# Patient Record
Sex: Female | Born: 1995 | Race: White | Hispanic: Yes | Smoking: Never smoker
Health system: Southern US, Community
[De-identification: ages and names within clinical notes are randomized; demographics above are authoritative.]

## PROBLEM LIST (undated history)

## (undated) DIAGNOSIS — O1403 Mild to moderate pre-eclampsia, third trimester: Secondary | ICD-10-CM

## (undated) DIAGNOSIS — Z789 Other specified health status: Secondary | ICD-10-CM

## (undated) HISTORY — PX: OTHER SURGICAL HISTORY: SHX169

## (undated) HISTORY — DX: Other specified health status: Z78.9

---

## 2016-10-16 DIAGNOSIS — E663 Overweight: Secondary | ICD-10-CM | POA: Insufficient documentation

## 2016-10-16 LAB — HM HIV SCREENING LAB: HM HIV Screening: NEGATIVE

## 2017-11-06 LAB — HM PAP SMEAR: HM Pap smear: NEGATIVE

## 2019-07-30 DIAGNOSIS — E663 Overweight: Secondary | ICD-10-CM

## 2019-07-31 ENCOUNTER — Other Ambulatory Visit: Payer: Self-pay

## 2019-07-31 ENCOUNTER — Ambulatory Visit (LOCAL_COMMUNITY_HEALTH_CENTER): Payer: Self-pay

## 2019-07-31 VITALS — BP 128/77 | Ht 66.0 in | Wt 177.8 lb

## 2019-07-31 DIAGNOSIS — Z30013 Encounter for initial prescription of injectable contraceptive: Secondary | ICD-10-CM

## 2019-07-31 DIAGNOSIS — Z3009 Encounter for other general counseling and advice on contraception: Secondary | ICD-10-CM

## 2019-07-31 DIAGNOSIS — Z3042 Encounter for surveillance of injectable contraceptive: Secondary | ICD-10-CM

## 2019-07-31 MED ORDER — MEDROXYPROGESTERONE ACETATE 150 MG/ML IM SUSP
150.0000 mg | Freq: Once | INTRAMUSCULAR | Status: AC
  Administered 2019-07-31: 17:00:00 150 mg via INTRAMUSCULAR

## 2019-07-31 NOTE — Progress Notes (Signed)
Patient in for Depo at 11 3/7. Depo given today, right deltoid, per 12/04/2018 order Wilburn Mylar, FNP) for DMPA 150mg , q 11-13 wks, for 1 year. Patient tolerated Depo well, and given next Depo due card.Jenetta Downer, RN

## 2019-10-20 ENCOUNTER — Other Ambulatory Visit: Payer: Self-pay

## 2019-10-20 ENCOUNTER — Ambulatory Visit (LOCAL_COMMUNITY_HEALTH_CENTER): Payer: Self-pay

## 2019-10-20 VITALS — BP 125/66 | Ht 66.0 in | Wt 176.5 lb

## 2019-10-20 DIAGNOSIS — Z3009 Encounter for other general counseling and advice on contraception: Secondary | ICD-10-CM

## 2019-10-20 DIAGNOSIS — Z30013 Encounter for initial prescription of injectable contraceptive: Secondary | ICD-10-CM

## 2019-10-20 MED ORDER — MEDROXYPROGESTERONE ACETATE 150 MG/ML IM SUSP
150.0000 mg | Freq: Once | INTRAMUSCULAR | Status: AC
  Administered 2019-10-20: 16:00:00 150 mg via INTRAMUSCULAR

## 2019-10-20 NOTE — Progress Notes (Signed)
Last physical at [facility] was 12/04/2018. Last depo at [facility] 07/31/2019; 11.4 weeks post depo.  DMPA 150 mg IM administered per Jerline Pain, FNP order dated 12/04/2018. Language Line used.

## 2020-01-07 ENCOUNTER — Encounter: Payer: Self-pay | Admitting: Family Medicine

## 2020-01-07 ENCOUNTER — Other Ambulatory Visit: Payer: Self-pay

## 2020-01-07 ENCOUNTER — Ambulatory Visit (LOCAL_COMMUNITY_HEALTH_CENTER): Payer: Self-pay | Admitting: Family Medicine

## 2020-01-07 VITALS — BP 108/75 | Ht 65.0 in | Wt 182.4 lb

## 2020-01-07 DIAGNOSIS — Z3042 Encounter for surveillance of injectable contraceptive: Secondary | ICD-10-CM

## 2020-01-07 DIAGNOSIS — Z30013 Encounter for initial prescription of injectable contraceptive: Secondary | ICD-10-CM

## 2020-01-07 DIAGNOSIS — Z01419 Encounter for gynecological examination (general) (routine) without abnormal findings: Secondary | ICD-10-CM

## 2020-01-07 MED ORDER — THERA VITAL M PO TABS: 1.0000 | ORAL_TABLET | Freq: Every day | ORAL | 3 refills | Status: AC

## 2020-01-07 MED ORDER — MEDROXYPROGESTERONE ACETATE 150 MG/ML IM SUSP
150.0000 mg | INTRAMUSCULAR | Status: AC
  Administered 2020-01-07 – 2020-09-07 (×3): 150 mg via INTRAMUSCULAR

## 2020-01-07 NOTE — Progress Notes (Signed)
Family Planning Visit- Initial Visit  Subjective:  Hannah Savage is a 24 y.o. being seen today for an initial well woman visit and to discuss family planning options.  She is currently using Depo-Provera injections for pregnancy prevention. Patient reports she does not want a pregnancy in the next year.  Patient has the following medical conditions has Overweight on their problem list.  Chief Complaint  Patient presents with  . Contraception    Patient reports she is here for physical and depo. Has been using Depo x3 yrs, does not get periods.   Pt denies all of the following, which are contraindications to Depo use: Known breast cancer Pregnancy Also denies: Hypertension (CDC cat 2 if mild, cat 3 if severe) Severe cirrhosis, hepatocellular adenoma Diabetes with nephrosis or vascular complications Ischemic heart disease or multiple risk factors for atherosclerotic disease, and some forms of lupus Unexplained vaginal bleeding Pregnancy planned within the next year Long-term use of corticosteroid therapy in women with a history of, or risk factors for, nontraumatic (frailty) fractures.  Current use of aminoglutethimide (usually for the treatment of Cushing's syndrome) because aminoglutethimide may increase metabolism of progestins   Body mass index is 30.35 kg/m. - Patient is eligible for diabetes screening based on BMI and age >69?  not applicable HA1C ordered? not applicable  Patient reports 1 of partners in last year. Desires STI screening?  No - decliens all STI testing today  Does the patient have a current or past history of drug use? No   No components found for: HCV]   Health Maintenance Due  Topic Date Due  . TETANUS/TDAP  07/08/2015  . INFLUENZA VACCINE  07/25/2019    ROS  The following portions of the patient's history were reviewed and updated as appropriate: allergies, current medications, past family history, past medical history, past social  history, past surgical history and problem list. Problem list updated.   See flowsheet for other program required questions.  Objective:   Vitals:   01/07/20 1553  BP: 108/75  Weight: 182 lb 6.4 oz (82.7 kg)  Height: 5\' 5"  (1.651 m)    Physical Exam  Vitals and nursing note reviewed.  Constitutional:      Appearance: Normal appearance.  HENT:     Head: Normocephalic and atraumatic.     Mouth/Throat:     Mouth: Mucous membranes are moist.     Pharynx: Oropharynx is clear. No oropharyngeal exudate or posterior oropharyngeal erythema.  Eyes:     Conjunctiva/sclera: Conjunctivae normal.  Neck:     Thyroid: No thyroid mass, thyromegaly or thyroid tenderness.  Cardiovascular:     Rate and Rhythm: Normal rate and regular rhythm.     Pulses: Normal pulses.     Heart sounds: Normal heart sounds.  Pulmonary:     Effort: Pulmonary effort is normal.     Breath sounds: Normal breath sounds.  Chest:     Breasts:        Right: Normal. No swelling, mass, nipple discharge, skin change or tenderness.        Left: Normal. No swelling, mass, nipple discharge, skin change or tenderness.  Abdominal:     General: Abdomen is flat.     Palpations: There is no mass.     Tenderness: There is no abdominal tenderness. There is no rebound.  Genitourinary: deferred Lymphadenopathy:     Head:     Right side of head: No preauricular or posterior auricular adenopathy.     Left  side of head: No preauricular or posterior auricular adenopathy.     Cervical: No cervical adenopathy.     Upper Body:     Right upper body: No supraclavicular or axillary adenopathy.     Left upper body: No supraclavicular or axillary adenopathy.  Skin:    General: Skin is warm and dry.     Findings: No rash.  Neurological:     Mental Status: She is alert and oriented to person, place, and time.     Assessment and Plan:  Hannah Savage is a 24 y.o. female presenting to the [facility] Department  for an initial well woman exam/family planning visit  Contraception counseling: Reviewed all forms of birth control options in the tiered based approach. available including abstinence; over the counter/barrier methods; hormonal contraceptive medication including pill, patch, ring, injection,contraceptive implant; hormonal and nonhormonal IUDs; permanent sterilization options including vasectomy and the various tubal sterilization modalities. Risks, benefits, and typical effectiveness rates were reviewed.  Questions were answered.  Written information was also given to the patient to review.  Patient desires depo, this was prescribed for patient. She will follow up in  3 months for surveillance.  She was told to call with any further questions, or with any concerns about this method of contraception.  Emphasized use of condoms 100% of the time for STI prevention.  ECP: n/a - continuous use of depo    1. Well woman exam -Physical exam today. Advised GC/chlamydia screening d/t age <2, pt declines this today as well as other STI screenings.  -Pap will be due 10/2020.  2. Encounter for surveillance of injectable contraceptive -Rx depo x1 yr. Counseling as above.  -Risks of Depo use >2 yrs discussed with pt. Alternative BCM discussed, pt would like to continue Depo. Suggested calcium (1200 mg daily) and vitamin D (800 IU daily) supplements, weight bearing exercise and avoiding cigarette smoking and excessive alcohol consumption to promote bone health.  -Follow up in 3 months for repeat Depo Provera injection.  - medroxyPROGESTERone (DEPO-PROVERA) injection 150 mg    Return in about 3 months (around 04/06/2020) for Depo.  No future appointments.  Kandee Keen, PA-C

## 2020-01-07 NOTE — Progress Notes (Signed)
In for physical and Depo; declines HIV/RPR testing Sharlette Dense, RN

## 2020-04-04 ENCOUNTER — Other Ambulatory Visit: Payer: Self-pay

## 2020-04-04 ENCOUNTER — Ambulatory Visit (LOCAL_COMMUNITY_HEALTH_CENTER): Payer: Self-pay

## 2020-04-04 VITALS — BP 121/75 | Ht 65.0 in | Wt 184.5 lb

## 2020-04-04 DIAGNOSIS — Z3042 Encounter for surveillance of injectable contraceptive: Secondary | ICD-10-CM

## 2020-04-04 DIAGNOSIS — Z30013 Encounter for initial prescription of injectable contraceptive: Secondary | ICD-10-CM

## 2020-04-04 DIAGNOSIS — Z3009 Encounter for other general counseling and advice on contraception: Secondary | ICD-10-CM

## 2020-04-04 MED ORDER — MULTIVITAMINS PO CAPS: 1.0000 | ORAL_CAPSULE | Freq: Every day | ORAL | 0 refills | Status: DC

## 2020-04-04 MED ORDER — MEDROXYPROGESTERONE ACETATE 150 MG/ML IM SUSP
150.0000 mg | Freq: Once | INTRAMUSCULAR | Status: AC
  Administered 2020-04-04: 150 mg via INTRAMUSCULAR

## 2020-04-04 NOTE — Progress Notes (Signed)
12.4 weeks post depo today. DMPA 150 mg IM administered today per Samara Snide, PA order dated 01/07/20 (1 year depo order).

## 2020-06-22 ENCOUNTER — Other Ambulatory Visit: Payer: Self-pay

## 2020-06-22 ENCOUNTER — Ambulatory Visit (LOCAL_COMMUNITY_HEALTH_CENTER): Payer: Self-pay

## 2020-06-22 VITALS — BP 114/68 | Ht 65.0 in | Wt 184.0 lb

## 2020-06-22 DIAGNOSIS — Z3009 Encounter for other general counseling and advice on contraception: Secondary | ICD-10-CM

## 2020-06-22 DIAGNOSIS — Z30013 Encounter for initial prescription of injectable contraceptive: Secondary | ICD-10-CM

## 2020-06-22 MED ORDER — MULTI-VITAMIN/MINERALS PO TABS: 1.0000 | ORAL_TABLET | Freq: Every day | ORAL | 0 refills | Status: DC

## 2020-06-22 NOTE — Progress Notes (Signed)
Encouraged to select a pharmacy in the event agency ever needs to send a prescription for her. Depo administered without difficulty per 01/07/2020 written order of Samara Snide and client tolerated in right deltoid without complaint. Jossie Ng, RN

## 2020-09-07 ENCOUNTER — Other Ambulatory Visit: Payer: Self-pay

## 2020-09-07 ENCOUNTER — Ambulatory Visit (LOCAL_COMMUNITY_HEALTH_CENTER): Payer: Self-pay

## 2020-09-07 VITALS — BP 126/79 | Ht 65.0 in | Wt 183.0 lb

## 2020-09-07 DIAGNOSIS — Z3009 Encounter for other general counseling and advice on contraception: Secondary | ICD-10-CM

## 2020-09-07 DIAGNOSIS — Z30013 Encounter for initial prescription of injectable contraceptive: Secondary | ICD-10-CM

## 2020-09-07 DIAGNOSIS — Z3042 Encounter for surveillance of injectable contraceptive: Secondary | ICD-10-CM

## 2020-09-08 NOTE — Progress Notes (Signed)
Depo administered per Maximiano Coss PA 01/06/2020 order; tolerated well (left delt). Richmond Campbell, RN

## 2021-03-02 ENCOUNTER — Ambulatory Visit (LOCAL_COMMUNITY_HEALTH_CENTER): Payer: Self-pay

## 2021-03-02 ENCOUNTER — Other Ambulatory Visit: Payer: Self-pay

## 2021-03-02 VITALS — BP 127/73 | Ht 65.0 in | Wt 176.5 lb

## 2021-03-02 DIAGNOSIS — Z3201 Encounter for pregnancy test, result positive: Secondary | ICD-10-CM

## 2021-03-02 LAB — PREGNANCY, URINE: Preg Test, Ur: POSITIVE — AB

## 2021-03-02 MED ORDER — PRENATAL 27-0.8 MG PO TABS: 1.0000 | ORAL_TABLET | Freq: Every day | ORAL | 0 refills | Status: AC

## 2021-03-02 NOTE — Progress Notes (Signed)
UPT positive today. Plans prenatal care at [facility]. To clerk for preadmit. Lang line, interpreter. Jerel Shepherd, RN

## 2021-03-03 ENCOUNTER — Telehealth: Payer: Self-pay | Admitting: Family Medicine

## 2021-03-03 NOTE — Telephone Encounter (Signed)
Phone call to pt with interpreter Salli Real. Pt at [facility] for UPT yesterday. Discussed PT packet of info and sheet that states "Safe Medications in Pregnancy." Pt states she has the sheet, discussed it in detail. Pt plans to try OTC medications.

## 2021-03-03 NOTE — Telephone Encounter (Signed)
Pt. want to know if a rx for nausea can be called in?

## 2021-04-04 NOTE — Progress Notes (Signed)
OB abstract done per phone interview 04/02/21  Rlassiter, RN

## 2021-04-06 ENCOUNTER — Other Ambulatory Visit: Payer: Self-pay

## 2021-04-06 ENCOUNTER — Ambulatory Visit: Payer: Medicaid Other | Admitting: Physician Assistant

## 2021-04-06 ENCOUNTER — Encounter: Payer: Self-pay | Admitting: Physician Assistant

## 2021-04-06 VITALS — BP 113/70 | HR 79 | Temp 98.2°F | Ht 66.0 in | Wt 168.0 lb

## 2021-04-06 DIAGNOSIS — O99211 Obesity complicating pregnancy, first trimester: Secondary | ICD-10-CM

## 2021-04-06 DIAGNOSIS — Z6830 Body mass index (BMI) 30.0-30.9, adult: Secondary | ICD-10-CM | POA: Insufficient documentation

## 2021-04-06 DIAGNOSIS — O0990 Supervision of high risk pregnancy, unspecified, unspecified trimester: Secondary | ICD-10-CM | POA: Insufficient documentation

## 2021-04-06 DIAGNOSIS — Z1331 Encounter for screening for depression: Secondary | ICD-10-CM | POA: Diagnosis not present

## 2021-04-06 DIAGNOSIS — E669 Obesity, unspecified: Secondary | ICD-10-CM

## 2021-04-06 DIAGNOSIS — O0991 Supervision of high risk pregnancy, unspecified, first trimester: Secondary | ICD-10-CM

## 2021-04-06 DIAGNOSIS — O99210 Obesity complicating pregnancy, unspecified trimester: Secondary | ICD-10-CM | POA: Insufficient documentation

## 2021-04-06 DIAGNOSIS — Z23 Encounter for immunization: Secondary | ICD-10-CM

## 2021-04-06 LAB — URINALYSIS
Bilirubin, UA: NEGATIVE
Glucose, UA: NEGATIVE
Leukocytes,UA: NEGATIVE
Nitrite, UA: NEGATIVE
Protein,UA: NEGATIVE
RBC, UA: NEGATIVE
Specific Gravity, UA: 1.03 (ref 1.005–1.030)
Urobilinogen, Ur: 0.2 mg/dL (ref 0.2–1.0)
pH, UA: 6 (ref 5.0–7.5)

## 2021-04-06 MED ORDER — PROMETHAZINE HCL 25 MG PO TABS: 25.0000 mg | ORAL_TABLET | Freq: Four times a day (QID) | ORAL | 0 refills | Status: DC | PRN

## 2021-04-06 MED ORDER — ASPIRIN EC 81 MG PO TBEC: 81.0000 mg | DELAYED_RELEASE_TABLET | Freq: Every day | ORAL | Status: AC

## 2021-04-06 NOTE — Progress Notes (Signed)
Patient here for new OB.   Will need Hgb redrawn next OB appt.   Wet Prep reviewed with provider, Terance Ice, PA-C. No treatment indicated.   Patient reports daily vomiting after every meal and constant nausea. Provider made aware.   Patient aware [facility] will contact patient for [place] and patient informed to have voicemail set up on her phone.   Floy Sabina, RN

## 2021-04-06 NOTE — Progress Notes (Signed)
[facility] DEPT [facility] [address] [place] [address] [id number]  INITIAL PRENATAL VISIT NOTE  Subjective:  Hannah Savage Burnetta Sabin is a 25 y.o. G1P0000 at [redacted]w[redacted]d being seen today to start prenatal care at the [facility] Department.  She is currently monitored for the following issues for this high-risk pregnancy and has Supervision of high-risk pregnancy, unspecified trimester; Obesity affecting pregnancy, antepartum; Class 1 obesity without serious comorbidity with body mass index (BMI) of 30.0 to 30.9 in adult; and Positive depression screening on their problem list.  Patient reports nausea and vomiting.  Contractions: Not present. Vag. Bleeding: None.  Movement: Absent. Denies leaking of fluid.   Indications for ASA therapy (per uptodate) One of the following: Previous pregnancy with preeclampsia, especially early onset and with an adverse outcome No Multifetal gestation No Chronic hypertension No Type 1 or 2 diabetes mellitus No Chronic kidney disease No Autoimmune disease (antiphospholipid syndrome, systemic lupus erythematosus) No  Two or more of the following: Nulliparity Yes Obesity (body mass index >30 kg/m2) Yes Family history of preeclampsia in mother or sister No Age ?35 years No Sociodemographic characteristics (African American race, low socioeconomic level) Yes Personal risk factors (eg, previous pregnancy with low birth weight or small for gestational age infant, previous adverse pregnancy outcome [eg, stillbirth], interval >10 years between pregnancies) No   The following portions of the patient's history were reviewed and updated as appropriate: allergies, current medications, past family history, past medical history, past social history, past surgical history and problem list. Problem list updated.  Objective:   Vitals:   04/06/21 1330  BP: 113/70  Pulse: 79  Temp: 98.2 F (36.8  C)  Weight: 185 lb (83.9 kg)    Fetal Status: Fetal Heart Rate (bpm): 156 Fundal Height: 14 cm Movement: Absent  Presentation: Undeterminable  Physical Exam Constitutional:      Appearance: Normal appearance.  HENT:     Mouth/Throat:     Mouth: Mucous membranes are moist.     Pharynx: Oropharynx is clear.  Eyes:     Extraocular Movements: Extraocular movements intact.  Neck:     Comments: No thyromegaly or thyroid mass Cardiovascular:     Rate and Rhythm: Normal rate and regular rhythm.     Pulses: Normal pulses.     Heart sounds: Normal heart sounds.  Pulmonary:     Effort: Pulmonary effort is normal.     Breath sounds: Normal breath sounds.  Chest:  Breasts:     Tanner Score is 5.     Right: No inverted nipple, mass or axillary adenopathy.     Left: No inverted nipple, mass or axillary adenopathy.    Abdominal:     General: Bowel sounds are normal.     Palpations: Abdomen is soft.     Tenderness: There is no abdominal tenderness.  Genitourinary:    General: Normal vulva.     Exam position: Lithotomy position.     Pubic Area: No rash.      Labia:        Right: No rash, tenderness or lesion.        Left: No rash, tenderness or lesion.      Urethra: No urethral swelling or urethral lesion.     Vagina: Vaginal discharge present. No tenderness or bleeding.     Cervix: No cervical motion tenderness or friability.     Uterus: Enlarged. Not tender.      Adnexa:  Right: No tenderness.         Left: No tenderness.       Rectum: No external hemorrhoid.     Comments: Uterus 14 wk size Musculoskeletal:        General: No swelling or deformity. Normal range of motion.     Cervical back: Normal range of motion and neck supple.     Right lower leg: No edema.     Left lower leg: No edema.  Lymphadenopathy:     Cervical: No cervical adenopathy.     Upper Body:     Right upper body: No axillary adenopathy.     Left upper body: No axillary adenopathy.     Lower Body:  No right inguinal adenopathy. No left inguinal adenopathy.  Skin:    General: Skin is warm and dry.  Neurological:     General: No focal deficit present.     Mental Status: She is alert and oriented to person, place, and time.  Psychiatric:        Behavior: Behavior normal.        Thought Content: Thought content normal.        Judgment: Judgment normal.     Assessment and Plan:  Pregnancy: G1P0000 at [redacted]w[redacted]d  1. Supervision of high-risk pregnancy, unspecified trimester Comfort measures for nausea/vomiting as well as promethasoine given. Plan anat [place] at 19-20 wk. Pt declines first screen/early aneuploidy testing. Has had 1st 2 COVID vaccines, last in June 2021, enc to obtain first booster. - Pap IG (Image Guided) - Flu Vaccine QUAD 4mo+IM (Fluarix, Fluzone & Alfiuria Quad PF) - Chlamydia/GC NAA, Confirmation - Comprehensive metabolic panel - HCV Ab w Reflex to Quant PCR - Hgb A1c w/o eAG - HIV Antibody (routine testing w rflx) - Prenatal Profile with Varicella(282020) - Urinalysis (Urine Dip) - Glucose tolerance, 1 hour - TSH - Urine Culture - QuantiFERON-TB Gold Plus - Protein / creatinine ratio, urine  (Spot) - Hemoglobinopathy evaluation -[id number]  2. Obesity affecting pregnancy, antepartum Counseled re: appropriate wt gain. Start daily low dose aspirin at 12 wk and continue til 36 wk. - Amb ref to Medical Nutrition Therapy-MNT  3. Positive depression screening Declines counseling eval. See prob list.  Discussed overview of care and coordination with inpatient delivery practices including [facility], [facility], [facility] and [facility].     Preterm labor symptoms and general obstetric precautions including but not limited to vaginal bleeding, contractions, leaking of fluid and fetal movement were reviewed in detail with the patient.  Please refer to After Visit Summary for other counseling recommendations.   Return in about 4 weeks (around 05/04/2021) for Routine  prenatal care.  Future Appointments  Date Time Provider Department Center  05/04/2021  4:00 PM [facility] PROVIDER [facility] None    Landry Dyke, PA-C

## 2021-04-07 LAB — COMPREHENSIVE METABOLIC PANEL
ALT: 19 IU/L (ref 0–32)
AST: 15 IU/L (ref 0–40)
Albumin/Globulin Ratio: 1.7 (ref 1.2–2.2)
Albumin: 4.3 g/dL (ref 3.9–5.0)
Alkaline Phosphatase: 70 IU/L (ref 44–121)
BUN/Creatinine Ratio: 14 (ref 9–23)
BUN: 8 mg/dL (ref 6–20)
Bilirubin Total: 0.2 mg/dL (ref 0.0–1.2)
CO2: 22 mmol/L (ref 20–29)
Calcium: 9.6 mg/dL (ref 8.7–10.2)
Chloride: 101 mmol/L (ref 96–106)
Creatinine, Ser: 0.59 mg/dL (ref 0.57–1.00)
Globulin, Total: 2.6 g/dL (ref 1.5–4.5)
Glucose: 108 mg/dL — ABNORMAL HIGH (ref 65–99)
Potassium: 3.5 mmol/L (ref 3.5–5.2)
Sodium: 136 mmol/L (ref 134–144)
Total Protein: 6.9 g/dL (ref 6.0–8.5)
eGFR: 129 mL/min/{1.73_m2} (ref >59)

## 2021-04-07 LAB — CBC/D/PLT+RPR+RH+ABO+RUB AB...
Antibody Screen: NEGATIVE
Basophils Absolute: 0 10*3/uL (ref 0.0–0.2)
Basos: 0 %
EOS (ABSOLUTE): 0.1 10*3/uL (ref 0.0–0.4)
Eos: 1 %
Hematocrit: 38.5 % (ref 34.0–46.6)
Hemoglobin: 13.1 g/dL (ref 11.1–15.9)
Hepatitis B Surface Ag: NEGATIVE
Immature Grans (Abs): 0.1 10*3/uL (ref 0.0–0.1)
Immature Granulocytes: 1 %
Lymphocytes Absolute: 2.3 10*3/uL (ref 0.7–3.1)
Lymphs: 19 %
MCH: 31.2 pg (ref 26.6–33.0)
MCHC: 34 g/dL (ref 31.5–35.7)
MCV: 92 fL (ref 79–97)
Monocytes Absolute: 0.6 10*3/uL (ref 0.1–0.9)
Monocytes: 5 %
Neutrophils Absolute: 8.7 10*3/uL — ABNORMAL HIGH (ref 1.4–7.0)
Neutrophils: 74 %
Platelets: 186 10*3/uL (ref 150–450)
RBC: 4.2 x10E6/uL (ref 3.77–5.28)
RDW: 12.8 % (ref 11.7–15.4)
RPR Ser Ql: NONREACTIVE
Rh Factor: POSITIVE
Rubella Antibodies, IGG: <0.9 index — ABNORMAL LOW (ref >0.99)
Varicella zoster IgG: 1109 index (ref >165)
WBC: 11.7 10*3/uL — ABNORMAL HIGH (ref 3.4–10.8)

## 2021-04-07 LAB — HCV INTERPRETATION

## 2021-04-07 LAB — TSH: TSH: 1.51 u[IU]/mL (ref 0.450–4.500)

## 2021-04-07 LAB — HIV-1/HIV-2 QUALITATIVE RNA
HIV-1 RNA, Qualitative: NONREACTIVE
HIV-2 RNA, Qualitative: NONREACTIVE

## 2021-04-07 LAB — HCV AB W REFLEX TO QUANT PCR: HCV Ab: <0.1 s/co ratio (ref 0.0–0.9)

## 2021-04-07 LAB — HGB A1C W/O EAG: Hgb A1c MFr Bld: 5.3 % (ref 4.8–5.6)

## 2021-04-09 LAB — URINE CULTURE: Organism ID, Bacteria: NO GROWTH

## 2021-04-09 LAB — PROTEIN / CREATININE RATIO, URINE
Creatinine, Urine: 242.4 mg/dL
Protein, Ur: 16.2 mg/dL
Protein/Creat Ratio: 67 mg/g creat (ref 0–200)

## 2021-04-09 LAB — CHLAMYDIA/GC NAA, CONFIRMATION
Chlamydia trachomatis, NAA: NEGATIVE
Neisseria gonorrhoeae, NAA: NEGATIVE

## 2021-04-10 DIAGNOSIS — Z2839 Other underimmunization status: Secondary | ICD-10-CM | POA: Insufficient documentation

## 2021-04-10 DIAGNOSIS — O09899 Supervision of other high risk pregnancies, unspecified trimester: Secondary | ICD-10-CM | POA: Insufficient documentation

## 2021-04-10 LAB — PAP IG (IMAGE GUIDED): PAP Smear Comment: 0

## 2021-04-10 LAB — HGB FRACTIONATION CASCADE
Hgb A2: 2.7 % (ref 1.8–3.2)
Hgb A: 96.8 % (ref 96.4–98.8)
Hgb F: 0.5 % (ref 0.0–2.0)
Hgb S: 0 %

## 2021-04-10 LAB — QUANTIFERON-TB GOLD PLUS
QuantiFERON Mitogen Value: >10 IU/mL
QuantiFERON Nil Value: 0.03 IU/mL
QuantiFERON TB1 Ag Value: 0.06 IU/mL
QuantiFERON TB2 Ag Value: 0.05 IU/mL
QuantiFERON-TB Gold Plus: NEGATIVE

## 2021-04-10 LAB — GLUCOSE, 1 HOUR GESTATIONAL: Gestational Diabetes Screen: 107 mg/dL (ref 65–139)

## 2021-04-10 NOTE — Progress Notes (Signed)
  Unable to review labs; labs not in maternity pool to manage.  Labs reviewed by RN and will be noted here as a whole.  Labs to be reviewed by provider will be sent via staff message to Junious Dresser, [place].   Labs reviewed by RN:   A. Chlamydia/GC - REVIEWED.  a. Chlamydia - Negative  b. Gonorrhea -  Negative  B. HCV Ab with Reflex - REVIEWED a. HCV - Negative   C. TSH - provider will review.   D. Prenatal Profile without Varicella/Rubella - REVIEWED.   a. Hep B Surface antigen -  Negative  b. RPR - Non-reactive  c. ABO grouping -  O d. RH Factor - positive  e. Antibody Screen - Negative  f. Hemoglobin - 13.1   E. Varicella and Rubella Zoster Antibody - REVIEWED.  a. Varicella - immune  b.   Rubella - NON-Immune. counsel client at F/U and offer MMR vax PP  F. Comprehensive Metabolic Panel   Provider to review.   G. Hbg A1C  - provider to review.   H. HIV-1/HIV-2 - REVIEWED.  a. HIV 1 - non-reactive b. HIV 2- non-reactive   I. Interpretation - REVIEWED a. HCV - negative   J. Sickle Cell Screen - REVIWED a. Sickle cell screen - Negative   K. Urine Culture - REVIEWED a. No growth noted   L.  Protein/Creatinine ratio, Urine (Spot) - provider to review.    Will send staff message to provider to review TSH, CMP, A1C, and spot urine.

## 2021-04-15 NOTE — Progress Notes (Signed)
Chart reviewed by Pharmacist  Suzanne Walker PharmD, Contract Pharmacist at [facility]  

## 2021-05-04 ENCOUNTER — Encounter: Payer: Self-pay | Admitting: Physician Assistant

## 2021-05-04 ENCOUNTER — Ambulatory Visit: Payer: Self-pay | Admitting: Physician Assistant

## 2021-05-04 ENCOUNTER — Other Ambulatory Visit: Payer: Self-pay

## 2021-05-04 VITALS — BP 119/74 | HR 99 | Temp 98.6°F | Wt 172.2 lb

## 2021-05-04 DIAGNOSIS — O0990 Supervision of high risk pregnancy, unspecified, unspecified trimester: Secondary | ICD-10-CM

## 2021-05-04 DIAGNOSIS — O99210 Obesity complicating pregnancy, unspecified trimester: Secondary | ICD-10-CM

## 2021-05-04 DIAGNOSIS — Z1331 Encounter for screening for depression: Secondary | ICD-10-CM

## 2021-05-04 NOTE — Progress Notes (Unsigned)
Verified has [facility] contact card. Client not yet taking Aspirin and counseled to begin 81 mg QD (written down for client). Quad screen today. Jossie Ng, RN [facility] referral for anatomy US faxed with [employer] (fax confirmation received). Client aware [facility] will attempt to call her x3 with appt. Jossie Ng, RN  Client 20% pay per Artelia Laroche in [place]. Client has concerns about cost of [facility] anatomy US. Maggie Music therapist for client to take with her to appt. Jossie Ng, RN

## 2021-05-04 NOTE — Progress Notes (Signed)
   PRENATAL VISIT NOTE  Subjective:  Hannah Savage is a 25 y.o. G1P0000 at [redacted]w[redacted]d being seen today for ongoing prenatal care.  She is currently monitored for the following issues for this high-risk pregnancy and has Supervision of high-risk pregnancy, unspecified trimester; Obesity affecting pregnancy, antepartum; Class 1 obesity without serious comorbidity with body mass index (BMI) of 30.0 to 30.9 in adult; Positive depression screening; and Rubella non-immune status, antepartum on their problem list.  Patient reports no complaints.  Contractions: Not present. Vag. Bleeding: None.  Movement: Absent. Denies leaking of fluid/ROM.   The following portions of the patient's history were reviewed and updated as appropriate: allergies, current medications, past family history, past medical history, past social history, past surgical history and problem list. Problem list updated.  Objective:   Vitals:   05/04/21 1600  BP: 119/74  Pulse: 99  Temp: 98.6 F (37 C)  Weight: 172 lb 3.2 oz (78.1 kg)    Fetal Status: Fetal Heart Rate (bpm): 156 Fundal Height: 16 cm Movement: Absent     General:  Alert, oriented and cooperative. Patient is in no acute distress.  Skin: Skin is warm and dry. No rash noted.   Cardiovascular: Normal heart rate noted  Respiratory: Normal respiratory effort, no problems with respiration noted  Abdomen: Soft, gravid, appropriate for gestational age.  Pain/Pressure: Absent     Pelvic: Cervical exam deferred        Extremities: Normal range of motion.  Edema: None  Mental Status: Normal mood and affect. Normal behavior. Normal judgment and thought content.   Assessment and Plan:  Pregnancy: G1P0000 at [redacted]w[redacted]d  1. Supervision of high-risk pregnancy, unspecified trimester Nausea has resolved, did not need to take promethazine. No new concerns. Desires quad screen today. Schedule anat [place] in 3-5 weeks.   2. Obesity affecting pregnancy, antepartum Enc to start low  dose daily aspirin.  3. Positive depression screening States mood is good, denies depressive sx. Will continue to monitor.   Preterm labor symptoms and general obstetric precautions including but not limited to vaginal bleeding, contractions, leaking of fluid and fetal movement were reviewed in detail with the patient. Please refer to After Visit Summary for other counseling recommendations.  Return in about 4 weeks (around 06/01/2021) for Routine prenatal care.  No future appointments.  Landry Dyke, PA-C

## 2021-05-08 ENCOUNTER — Telehealth: Payer: Self-pay

## 2021-05-08 NOTE — Telephone Encounter (Signed)
TC to patient to let her know that [facility] has called her twice to set up her U/S. Patient needs number to call [facility] to set up her U/S:  [phone]. Unable to LM due to no VM set up. TC to emergency contact (her partner). No VM set up and unable to LM.Marland KitchenBurt Knack, RN

## 2021-05-09 NOTE — Telephone Encounter (Signed)
Call to client who states received call from [facility] this am and [place] appt is 05/29/2021 at [facility]). Verified above in [facility]. Jossie Ng, RN

## 2021-05-17 NOTE — Addendum Note (Signed)
Addended by: Heywood Bene on: 05/17/2021 04:18 PM   Modules accepted: Orders

## 2021-06-01 ENCOUNTER — Other Ambulatory Visit: Payer: Self-pay

## 2021-06-01 ENCOUNTER — Ambulatory Visit: Payer: Self-pay | Admitting: Physician Assistant

## 2021-06-01 ENCOUNTER — Encounter: Payer: Self-pay | Admitting: Physician Assistant

## 2021-06-01 VITALS — BP 108/66 | HR 99 | Temp 98.7°F | Wt 178.8 lb

## 2021-06-01 DIAGNOSIS — Z1331 Encounter for screening for depression: Secondary | ICD-10-CM

## 2021-06-01 DIAGNOSIS — O0990 Supervision of high risk pregnancy, unspecified, unspecified trimester: Secondary | ICD-10-CM

## 2021-06-01 DIAGNOSIS — O99210 Obesity complicating pregnancy, unspecified trimester: Secondary | ICD-10-CM

## 2021-06-01 DIAGNOSIS — O0992 Supervision of high risk pregnancy, unspecified, second trimester: Secondary | ICD-10-CM

## 2021-06-01 DIAGNOSIS — O99212 Obesity complicating pregnancy, second trimester: Secondary | ICD-10-CM

## 2021-06-01 NOTE — Progress Notes (Signed)
   PRENATAL VISIT NOTE  Subjective:  Hannah Savage is a 25 y.o. G1P0000 at [redacted]w[redacted]d being seen today for ongoing prenatal care.  She is currently monitored for the following issues for this high-risk pregnancy and has Supervision of high-risk pregnancy, unspecified trimester; Obesity affecting pregnancy, antepartum; Class 1 obesity without serious comorbidity with body mass index (BMI) of 30.0 to 30.9 in adult; Positive depression screening; and Rubella non-immune status, antepartum on their problem list.  Patient reports  occ bilateral groin pain with movement .  N/V completely resolved, eating very well now. Contractions: Not present. Vag. Bleeding: None.  Movement: Present. Denies leaking of fluid/ROM.   The following portions of the patient's history were reviewed and updated as appropriate: allergies, current medications, past family history, past medical history, past social history, past surgical history and problem list. Problem list updated.  Objective:   Vitals:   06/01/21 1556  BP: 108/66  Pulse: 99  Temp: 98.7 F (37.1 C)  Weight: 178 lb 12.8 oz (81.1 kg)    Fetal Status: Fetal Heart Rate (bpm): 152 Fundal Height: 19 cm Movement: Present     General:  Alert, oriented and cooperative. Patient is in no acute distress.  Skin: Skin is warm and dry. No rash noted.   Cardiovascular: Normal heart rate noted  Respiratory: Normal respiratory effort, no problems with respiration noted  Abdomen: Soft, gravid, appropriate for gestational age.  Pain/Pressure: Absent     Pelvic: Cervical exam deferred        Extremities: Normal range of motion.  Edema: None  Mental Status: Normal mood and affect. Normal behavior. Normal judgment and thought content.   Assessment and Plan:  Pregnancy: G1P0000 at [redacted]w[redacted]d  1. Supervision of high-risk pregnancy, unspecified trimester Suspect pelvic ligament pain, reassurance given. Counseled re: quad screen no increased risk. Reviewed normal fetal  anat per recent US.  2. Obesity affecting pregnancy, antepartum Pt has been "too lazy" to obtain aspirin. Counseled questionable benefit of starting at this gest age.  3. Positive depression screening Reports mood very good, attributes to vastly improved energy level, no N/V.   Preterm labor symptoms and general obstetric precautions including but not limited to vaginal bleeding, contractions, leaking of fluid and fetal movement were reviewed in detail with the patient. Please refer to After Visit Summary for other counseling recommendations.  Return in about 4 weeks (around 06/29/2021) for Routine prenatal care.  Future Appointments  Date Time Provider Department Center  06/29/2021  4:00 PM [facility] PROVIDER [facility] None    Landry Dyke, PA-C

## 2021-06-09 ENCOUNTER — Encounter: Payer: Self-pay | Admitting: Advanced Practice Midwife

## 2021-06-29 ENCOUNTER — Ambulatory Visit: Payer: Self-pay | Admitting: Advanced Practice Midwife

## 2021-06-29 ENCOUNTER — Other Ambulatory Visit: Payer: Self-pay

## 2021-06-29 VITALS — BP 106/68 | HR 96 | Temp 97.9°F | Wt 184.4 lb

## 2021-06-29 DIAGNOSIS — O99212 Obesity complicating pregnancy, second trimester: Secondary | ICD-10-CM

## 2021-06-29 DIAGNOSIS — O0990 Supervision of high risk pregnancy, unspecified, unspecified trimester: Secondary | ICD-10-CM

## 2021-06-29 DIAGNOSIS — O99210 Obesity complicating pregnancy, unspecified trimester: Secondary | ICD-10-CM

## 2021-06-29 DIAGNOSIS — Z1331 Encounter for screening for depression: Secondary | ICD-10-CM

## 2021-06-29 DIAGNOSIS — O0992 Supervision of high risk pregnancy, unspecified, second trimester: Secondary | ICD-10-CM

## 2021-06-29 MED ORDER — PRENATAL VITAMIN 27-0.8 MG PO TABS: 1.0000 | ORAL_TABLET | Freq: Every day | ORAL | 0 refills | Status: DC

## 2021-06-29 NOTE — Progress Notes (Signed)
   PRENATAL VISIT NOTE  Subjective:  Hannah Savage is a 25 y.o. G1P0000 at [redacted]w[redacted]d being seen today for ongoing prenatal care.  She is currently monitored for the following issues for this high-risk pregnancy and has Supervision of high-risk pregnancy, unspecified trimester; Obesity affecting pregnancy, antepartum; Class 1 obesity without serious comorbidity with body mass index (BMI) of 30.0 to 30.9 in adult; Positive depression screening; and Rubella non-immune status, antepartum on their problem list.  Patient reports no complaints.  Contractions: Not present. Vag. Bleeding: None.  Movement: Present. Denies leaking of fluid/ROM.   The following portions of the patient's history were reviewed and updated as appropriate: allergies, current medications, past family history, past medical history, past social history, past surgical history and problem list. Problem list updated.  Objective:   Vitals:   06/29/21 1616  BP: 106/68  Pulse: 96  Temp: 97.9 F (36.6 C)  Weight: 184 lb 6.4 oz (83.6 kg)    Fetal Status: Fetal Heart Rate (bpm): 160 Fundal Height: 22 cm Movement: Present     General:  Alert, oriented and cooperative. Patient is in no acute distress.  Skin: Skin is warm and dry. No rash noted.   Cardiovascular: Normal heart rate noted  Respiratory: Normal respiratory effort, no problems with respiration noted  Abdomen: Soft, gravid, appropriate for gestational age.  Pain/Pressure: Absent     Pelvic: Cervical exam deferred        Extremities: Normal range of motion.  Edema: None  Mental Status: Normal mood and affect. Normal behavior. Normal judgment and thought content.   Assessment and Plan:  Pregnancy: G1P0000 at [redacted]w[redacted]d  1. Supervision of high-risk pregnancy, unspecified trimester Working 40 hrs/wk. Feels well. Not exercising--counseled to do 3x/wk x 20 min Reviewed 05/29/21 u/s at 19 4/7 with AFI wnl, 3VC, anterior placenta, normal anatomy - Prenatal Vit-Fe  Fumarate-FA (PRENATAL VITAMIN) 27-0.8 MG TABS; Take 1 tablet by mouth daily.  Dispense: 100 tablet; Refill: 0  2. Obesity affecting pregnancy, antepartum -9.6 oz (-0.272 kg) Not taking ASA 81 mg 6 lb wt gain in last 4 wks  3. Positive depression screening Denies sxs depression   Preterm labor symptoms and general obstetric precautions including but not limited to vaginal bleeding, contractions, leaking of fluid and fetal movement were reviewed in detail with the patient. Please refer to After Visit Summary for other counseling recommendations.  Return in about 4 weeks (around 07/27/2021) for 28 week labs, routine PNC.  Future Appointments  Date Time Provider Department Center  07/27/2021  1:40 PM [facility] PROVIDER [facility] None    Alberteen Spindle, CNM

## 2021-06-29 NOTE — Progress Notes (Signed)
Pt denies visits to ER since last appt with [facility]. Taking PNV, requests more today; PNV dispensed. Not taking ASA. Reviewed PTL. Pt completed PHQ9 and CCNC form.

## 2021-07-27 ENCOUNTER — Ambulatory Visit: Payer: Self-pay | Admitting: Physician Assistant

## 2021-07-27 ENCOUNTER — Ambulatory Visit: Payer: Self-pay

## 2021-07-27 ENCOUNTER — Other Ambulatory Visit: Payer: Self-pay

## 2021-07-27 ENCOUNTER — Encounter: Payer: Self-pay | Admitting: Physician Assistant

## 2021-07-27 VITALS — BP 118/65 | HR 87 | Temp 97.8°F | Wt 188.0 lb

## 2021-07-27 DIAGNOSIS — Z1331 Encounter for screening for depression: Secondary | ICD-10-CM

## 2021-07-27 DIAGNOSIS — O99210 Obesity complicating pregnancy, unspecified trimester: Secondary | ICD-10-CM

## 2021-07-27 DIAGNOSIS — O0993 Supervision of high risk pregnancy, unspecified, third trimester: Secondary | ICD-10-CM

## 2021-07-27 DIAGNOSIS — O0990 Supervision of high risk pregnancy, unspecified, unspecified trimester: Secondary | ICD-10-CM

## 2021-07-27 DIAGNOSIS — O99213 Obesity complicating pregnancy, third trimester: Secondary | ICD-10-CM

## 2021-07-27 DIAGNOSIS — Z23 Encounter for immunization: Secondary | ICD-10-CM

## 2021-07-27 LAB — HEMOGLOBIN, FINGERSTICK: Hemoglobin: 12.5 g/dL (ref 11.1–15.9)

## 2021-07-27 MED ORDER — PROMETHAZINE HCL 25 MG PO TABS: 25.0000 mg | ORAL_TABLET | Freq: Four times a day (QID) | ORAL | 0 refills | Status: DC | PRN

## 2021-07-27 NOTE — Progress Notes (Signed)
   PRENATAL VISIT NOTE  Subjective:  Hannah Savage is a 25 y.o. G1P0000 at [redacted]w[redacted]d being seen today for ongoing prenatal care.  She is currently monitored for the following issues for this high-risk pregnancy and has Supervision of high-risk pregnancy, unspecified trimester; Obesity affecting pregnancy, antepartum; Class 1 obesity without serious comorbidity with body mass index (BMI) of 30.0 to 30.9 in adult; Positive depression screening; and Rubella non-immune status, antepartum on their problem list.  Patient reports vomiting.  Contractions: Not present. Vag. Bleeding: None.  Movement: Present. Denies leaking of fluid/ROM.   The following portions of the patient's history were reviewed and updated as appropriate: allergies, current medications, past family history, past medical history, past social history, past surgical history and problem list. Problem list updated.  Objective:   Vitals:   07/27/21 1333  BP: 118/65  Pulse: 87  Temp: 97.8 F (36.6 C)  Weight: 188 lb (85.3 kg)    Fetal Status: Fetal Heart Rate (bpm): 152 Fundal Height: 26 cm Movement: Present     General:  Alert, oriented and cooperative. Patient is in no acute distress.  Skin: Skin is warm and dry. No rash noted.   Cardiovascular: Normal heart rate noted  Respiratory: Normal respiratory effort, no problems with respiration noted  Abdomen: Soft, gravid, appropriate for gestational age.  Pain/Pressure: Absent     Pelvic: Cervical exam deferred        Extremities: Normal range of motion.  Edema: None  Mental Status: Normal mood and affect. Normal behavior. Normal judgment and thought content.   Assessment and Plan:  Pregnancy: G1P0000 at [redacted]w[redacted]d  1. Supervision of high-risk pregnancy, unspecified trimester Reports vomiting most days, also has heartburn and no relief with Tums. Has only gained 3 lbs TWG, up 1 lb since last visit. Trial po promethazine, enc frequent small meals, push fluids. Routine labs  today. Anticipatory guidance re: transition to every 2 week visits. - Glucose, 1 hour gestational - HIV-1/HIV-2 Qualitative RNA - RPR - Hemoglobin, fingerstick - promethazine (PHENERGAN) 25 MG tablet; Take 1 tablet (25 mg total) by mouth every 6 (six) hours as needed for nausea or vomiting.  Dispense: 15 tablet; Refill: 0  2. Obesity affecting pregnancy, antepartum Pt not taking aspirin, 3lb TWG.  3. Positive depression screening No current mood concerns.   Preterm labor symptoms and general obstetric precautions including but not limited to vaginal bleeding, contractions, leaking of fluid and fetal movement were reviewed in detail with the patient. Please refer to After Visit Summary for other counseling recommendations.  Due to language barrier, an interpreter was present during the provider portion of the visit with this patient.  Return in about 2 weeks (around 08/10/2021) for Routine prenatal care.  Future Appointments  Date Time Provider Department Center  08/10/2021  4:00 PM [facility] PROVIDER [facility] None    Landry Dyke, PA-C

## 2021-07-27 NOTE — Progress Notes (Addendum)
Here today for 27.2 week MH RV. Taking PNV QD. Denies ED/hospital visits since last RV. 28 week labs and Tdap today. Tawny Hopping, RN

## 2021-07-28 LAB — RPR: RPR Ser Ql: NONREACTIVE

## 2021-07-28 LAB — GLUCOSE, 1 HOUR GESTATIONAL: Gestational Diabetes Screen: 90 mg/dL (ref 65–139)

## 2021-07-29 LAB — HIV-1/HIV-2 QUALITATIVE RNA
HIV-1 RNA, Qualitative: NONREACTIVE
HIV-2 RNA, Qualitative: NONREACTIVE

## 2021-08-10 ENCOUNTER — Other Ambulatory Visit: Payer: Self-pay

## 2021-08-10 ENCOUNTER — Ambulatory Visit: Payer: Self-pay

## 2021-08-10 ENCOUNTER — Telehealth: Payer: Self-pay

## 2021-08-10 VITALS — BP 118/63 | HR 88 | Temp 98.2°F | Wt 189.8 lb

## 2021-08-10 DIAGNOSIS — O0990 Supervision of high risk pregnancy, unspecified, unspecified trimester: Secondary | ICD-10-CM

## 2021-08-10 DIAGNOSIS — O99210 Obesity complicating pregnancy, unspecified trimester: Secondary | ICD-10-CM

## 2021-08-10 DIAGNOSIS — Z1331 Encounter for screening for depression: Secondary | ICD-10-CM

## 2021-08-10 NOTE — Telephone Encounter (Signed)
Contacted patient today regarding coming in early at 1:40 to [facility] RV visit appointment. Patient states that will be great and resceduled her for 1:40pm.   Floy Sabina, RN

## 2021-08-10 NOTE — Progress Notes (Unsigned)
   PRENATAL VISIT NOTE  Subjective:  Hannah Savage is a 25 y.o. G1P0000 at [redacted]w[redacted]d being seen today for ongoing prenatal care.  She is currently monitored for the following issues for this high-risk pregnancy and has Supervision of high-risk pregnancy, unspecified trimester; Obesity affecting pregnancy, antepartum; Class 1 obesity without serious comorbidity with body mass index (BMI) of 30.0 to 30.9 in adult; Positive depression screening; and Rubella non-immune status, antepartum on their problem list.  Patient reports no complaints.  Contractions: Not present. Vag. Bleeding: None.  Movement: Present. Nausea has resolved, feeling well. Denies leaking of fluid/ROM.   The following portions of the patient's history were reviewed and updated as appropriate: allergies, current medications, past family history, past medical history, past social history, past surgical history and problem list. Problem list updated.  Objective:   Vitals:   08/10/21 1351  BP: 118/63  Pulse: 88  Temp: 98.2 F (36.8 C)  Weight: 189 lb 12.8 oz (86.1 kg)    Fetal Status: Fetal Heart Rate (bpm): 148 Fundal Height: 29 cm Movement: Present     General:  Alert, oriented and cooperative. Patient is in no acute distress.  Skin: Skin is warm and dry. No rash noted.   Cardiovascular: Normal heart rate noted  Respiratory: Normal respiratory effort, no problems with respiration noted  Abdomen: Soft, gravid, appropriate for gestational age.  Pain/Pressure: Absent     Pelvic: Cervical exam deferred        Extremities: Normal range of motion.  Edema: None  Mental Status: Normal mood and affect. Normal behavior. Normal judgment and thought content.   Assessment and Plan:  Pregnancy: G1P0000 at [redacted]w[redacted]d  1. Supervision of high-risk pregnancy, unspecified trimester Enc to obtain infant carseat, push liquids to maintain hydration.  2. Obesity affecting pregnancy, antepartum Appropriate interval and overall weight  gain. Not taking aspirin.  3. Positive depression screening Reports good mood, denies depressive sx, smiles during encounter.   Preterm labor symptoms and general obstetric precautions including but not limited to vaginal bleeding, contractions, leaking of fluid and fetal movement were reviewed in detail with the patient. Due to language barrier, an interpreter Juliene Pina) was present during the provider portion of the visit with this patient.  Please refer to After Visit Summary for other counseling recommendations.  Return in about 2 weeks (around 08/24/2021) for Routine prenatal care.  No future appointments.  Landry Dyke, PA-C

## 2021-08-24 ENCOUNTER — Encounter: Payer: Self-pay | Admitting: Physician Assistant

## 2021-08-24 ENCOUNTER — Ambulatory Visit: Payer: Self-pay | Admitting: Physician Assistant

## 2021-08-24 ENCOUNTER — Other Ambulatory Visit: Payer: Self-pay

## 2021-08-24 VITALS — BP 114/71 | HR 90 | Temp 97.2°F | Wt 194.2 lb

## 2021-08-24 DIAGNOSIS — Z1331 Encounter for screening for depression: Secondary | ICD-10-CM

## 2021-08-24 DIAGNOSIS — O0990 Supervision of high risk pregnancy, unspecified, unspecified trimester: Secondary | ICD-10-CM

## 2021-08-24 DIAGNOSIS — O99210 Obesity complicating pregnancy, unspecified trimester: Secondary | ICD-10-CM

## 2021-08-24 DIAGNOSIS — O99213 Obesity complicating pregnancy, third trimester: Secondary | ICD-10-CM

## 2021-08-24 DIAGNOSIS — O0993 Supervision of high risk pregnancy, unspecified, third trimester: Secondary | ICD-10-CM

## 2021-08-24 NOTE — Progress Notes (Signed)
   PRENATAL VISIT NOTE  Subjective:  Hannah Savage is a 25 y.o. G1P0000 at [redacted]w[redacted]d being seen today for ongoing prenatal care.  She is currently monitored for the following issues for this high-risk pregnancy and has Supervision of high-risk pregnancy, unspecified trimester; Obesity affecting pregnancy, antepartum; Class 1 obesity without serious comorbidity with body mass index (BMI) of 30.0 to 30.9 in adult; Positive depression screening; and Rubella non-immune status, antepartum on their problem list.  Patient reports no complaints.  Contractions: Not present. Vag. Bleeding: None.  Movement: Present. Denies leaking of fluid/ROM.   The following portions of the patient's history were reviewed and updated as appropriate: allergies, current medications, past family history, past medical history, past social history, past surgical history and problem list. Problem list updated.  Objective:   Vitals:   08/24/21 1611  BP: 114/71  Pulse: 90  Temp: (!) 97.2 F (36.2 C)  Weight: 194 lb 3.2 oz (88.1 kg)    Fetal Status: Fetal Heart Rate (bpm): 128 Fundal Height: 30 cm Movement: Present     General:  Alert, oriented and cooperative. Patient is in no acute distress.  Skin: Skin is warm and dry. No rash noted.   Cardiovascular: Normal heart rate noted  Respiratory: Normal respiratory effort, no problems with respiration noted  Abdomen: Soft, gravid, appropriate for gestational age.  Pain/Pressure: Absent     Pelvic: Cervical exam deferred        Extremities: Normal range of motion.  Edema: None  Mental Status: Normal mood and affect. Normal behavior. Normal judgment and thought content.   Assessment and Plan:  Pregnancy: G1P0000 at [redacted]w[redacted]d  1. Supervision of high-risk pregnancy, unspecified trimester N/V has resolved, used promethazine and does not need any more.  2. Obesity affecting pregnancy, antepartum Significant interval wt gain, attributable to N/V resolution. Will need to  monitor to make sure wt gain is not excessive.  3. Positive depression screening Pt smiling, good eye contact. Will continue to monitor for depressed mood.   Preterm labor symptoms and general obstetric precautions including but not limited to vaginal bleeding, contractions, leaking of fluid and fetal movement were reviewed in detail with the patient. Please refer to After Visit Summary for other counseling recommendations.  Return in about 2 weeks (around 09/07/2021) for Routine prenatal care.  Future Appointments  Date Time Provider Department Center  09/07/2021  4:00 PM [facility] PROVIDER [facility] None    Landry Dyke, PA-C

## 2021-09-07 ENCOUNTER — Other Ambulatory Visit: Payer: Self-pay

## 2021-09-07 ENCOUNTER — Encounter: Payer: Self-pay | Admitting: Physician Assistant

## 2021-09-07 ENCOUNTER — Ambulatory Visit: Payer: Self-pay | Admitting: Physician Assistant

## 2021-09-07 VITALS — BP 111/70 | HR 85 | Temp 98.3°F | Wt 197.6 lb

## 2021-09-07 DIAGNOSIS — Z1331 Encounter for screening for depression: Secondary | ICD-10-CM

## 2021-09-07 DIAGNOSIS — O0990 Supervision of high risk pregnancy, unspecified, unspecified trimester: Secondary | ICD-10-CM

## 2021-09-07 DIAGNOSIS — O99213 Obesity complicating pregnancy, third trimester: Secondary | ICD-10-CM

## 2021-09-07 DIAGNOSIS — O0993 Supervision of high risk pregnancy, unspecified, third trimester: Secondary | ICD-10-CM

## 2021-09-07 DIAGNOSIS — O99210 Obesity complicating pregnancy, unspecified trimester: Secondary | ICD-10-CM

## 2021-09-07 MED ORDER — PRENATAL MULTIVITAMIN CH: 1.0000 | ORAL_TABLET | Freq: Every day | ORAL | 0 refills | Status: DC

## 2021-09-07 NOTE — Progress Notes (Signed)
[facility]  PRENATAL VISIT NOTE  Subjective:  Hannah Savage is a 25 y.o. G1P0000 at [redacted]w[redacted]d being seen today for ongoing prenatal care.  She is currently monitored for the following issues for this high-risk pregnancy and has Supervision of high-risk pregnancy, unspecified trimester; Obesity affecting pregnancy, antepartum; Class 1 obesity without serious comorbidity with body mass index (BMI) of 30.0 to 30.9 in adult; Positive depression screening; and Rubella non-immune status, antepartum on their problem list.  Patient reports no complaints.  Contractions: Not present. Vag. Bleeding: None.  Movement: Present. Denies leaking of fluid/ROM.   The following portions of the patient's history were reviewed and updated as appropriate: allergies, current medications, past family history, past medical history, past social history, past surgical history and problem list. Problem list updated.  Objective:   Vitals:   09/07/21 1556  BP: 111/70  Pulse: 85  Temp: 98.3 F (36.8 C)  Weight: 197 lb 9.6 oz (89.6 kg)    Fetal Status: Fetal Heart Rate (bpm): 144 Fundal Height: 32 cm Movement: Present     General:  Alert, oriented and cooperative. Patient is in no acute distress.  Skin: Skin is warm and dry. No rash noted.   Cardiovascular: Normal heart rate noted  Respiratory: Normal respiratory effort, no problems with respiration noted  Abdomen: Soft, gravid, appropriate for gestational age.  Pain/Pressure: Absent     Pelvic: Cervical exam deferred        Extremities: Normal range of motion.  Edema: None  Mental Status: Normal mood and affect. Normal behavior. Normal judgment and thought content.   Assessment and Plan:  Pregnancy: G1P0000 at [redacted]w[redacted]d  1. Supervision of high-risk pregnancy, unspecified trimester Doing well. Watch fundal height. Appropriate interval growth, but consistently 2cm below corresponding weeks, though not out of  expected range. Has infant carseat and crib. Anticipatory guidance re: routine 36 wk labs at RV. - Prenatal Vit-Fe Fumarate-FA (PRENATAL MULTIVITAMIN) TABS tablet; Take 1 tablet by mouth daily at 12 noon.  Dispense: 100 tablet; Refill: 0  2. Obesity affecting pregnancy, antepartum TWG 15 lb.  3. Positive depression screening Bright but calm affect, states mood is happy, looking forward to meeting baby, continue to monitor.  Preterm labor symptoms and general obstetric precautions including but not limited to vaginal bleeding, contractions, leaking of fluid and fetal movement were reviewed in detail with the patient.  Due to language barrier, an interpreter  (M. [place]) was present during the provider portion of the visit with this patient.  Please refer to After Visit Summary for other counseling recommendations.  Return in about 2 weeks (around 09/21/2021) for Routine prenatal care, 36 wk labs.  Future Appointments  Date Time Provider Department Center  09/21/2021  4:00 PM [facility] PROVIDER [facility] None    Landry Dyke, PA-C

## 2021-09-21 ENCOUNTER — Ambulatory Visit: Payer: Self-pay | Admitting: Physician Assistant

## 2021-09-21 ENCOUNTER — Other Ambulatory Visit: Payer: Self-pay

## 2021-09-21 ENCOUNTER — Encounter: Payer: Self-pay | Admitting: Physician Assistant

## 2021-09-21 VITALS — BP 116/74 | HR 73 | Temp 97.7°F | Wt 199.8 lb

## 2021-09-21 DIAGNOSIS — Z1331 Encounter for screening for depression: Secondary | ICD-10-CM

## 2021-09-21 DIAGNOSIS — O0993 Supervision of high risk pregnancy, unspecified, third trimester: Secondary | ICD-10-CM

## 2021-09-21 DIAGNOSIS — O99213 Obesity complicating pregnancy, third trimester: Secondary | ICD-10-CM

## 2021-09-21 DIAGNOSIS — O99210 Obesity complicating pregnancy, unspecified trimester: Secondary | ICD-10-CM

## 2021-09-21 DIAGNOSIS — O0990 Supervision of high risk pregnancy, unspecified, unspecified trimester: Secondary | ICD-10-CM

## 2021-09-21 NOTE — Progress Notes (Signed)
Flu vaccine given today, left deltoid, tolerated well, VIS given. NCIR in TEFL teacher. Patient has chosen to switch delivering hospital to [facility] and has chosen [facility] for her delivering provider. New consent form signed and sent for scanning.Marland KitchenMarland KitchenBurt Knack, RN

## 2021-09-21 NOTE — Progress Notes (Signed)
[facility]  PRENATAL VISIT NOTE  Subjective:  Hannah Savage is a 25 y.o. G1P0000 at [redacted]w[redacted]d being seen today for ongoing prenatal care.  She is currently monitored for the following issues for this high-risk pregnancy and has Supervision of high-risk pregnancy, unspecified trimester; Obesity affecting pregnancy, antepartum; Class 1 obesity without serious comorbidity with body mass index (BMI) of 30.0 to 30.9 in adult; Positive depression screening; and Rubella non-immune status, antepartum on their problem list.  Patient reports  occ abd pain lasting 10 min .  Contractions: Not present. Vag. Bleeding: None.  Movement: Present. Denies leaking of fluid/ROM.   The following portions of the patient's history were reviewed and updated as appropriate: allergies, current medications, past family history, past medical history, past social history, past surgical history and problem list. Problem list updated.  Objective:   Vitals:   09/21/21 1644  BP: 116/74  Pulse: 73  Temp: 97.7 F (36.5 C)  Weight: 199 lb 12.8 oz (90.6 kg)    Fetal Status: Fetal Heart Rate (bpm): 136 Fundal Height: 34 cm Movement: Present  Presentation: Vertex  General:  Alert, oriented and cooperative. Patient is in no acute distress.  Skin: Skin is warm and dry. No rash noted.   Cardiovascular: Normal heart rate noted  Respiratory: Normal respiratory effort, no problems with respiration noted  Abdomen: Soft, gravid, appropriate for gestational age.  Pain/Pressure: Absent     Pelvic: Cervical exam deferred        Extremities: Normal range of motion.  Edema: Trace  Mental Status: Normal mood and affect. Normal behavior. Normal judgment and thought content.   Assessment and Plan:  Pregnancy: G1P0000 at [redacted]w[redacted]d  1. Supervision of high-risk pregnancy, unspecified trimester Size persistently 2cm < # weeks - sched [place] for fetal growth. Routine 36-wk labs today. Transition  to weekly visits. Enc annual flu shot. - Chlamydia/GC NAA, Confirmation - Culture, beta strep (group b only)  2. Obesity affecting pregnancy, antepartum Overall appropriate wt gain.  3. Positive depression screening Had pos screen at initial prenatal visit, subsequent visits doing well. Affect smiling, good eye contact, calm today. PHQ-9 score = 0 today. Continue to monitor.  Preterm labor symptoms and general obstetric precautions including but not limited to vaginal bleeding, contractions, leaking of fluid and fetal movement were reviewed in detail with the patient. Due to language barrier, an interpreter (M. [place]) was present during the provider portion of the visit with this patient.  Please refer to After Visit Summary for other counseling recommendations.   Return in about 1 week (around 09/28/2021) for Routine prenatal care.  No future appointments.  Landry Dyke, PA-C

## 2021-09-21 NOTE — Progress Notes (Signed)
Patient here for MH RV at 36 weeks. 36 week labs today, patient prefers provider to collect. 36 week packet given. Patient states she wants to deliver at [facility] instead of [facility]. Will discuss with patient after she sees provider.Burt Knack, RN

## 2021-09-22 ENCOUNTER — Telehealth: Payer: Self-pay

## 2021-09-22 NOTE — Telephone Encounter (Signed)
TC to patient to inform of [facility] U/S on 09/26/2021 at 1:30. Patient counseled to arrive 1:00 at the [facility], and can bring one person and no children. Patient states understanding. Interpreter, M. [place].Burt Knack, RN

## 2021-09-25 LAB — CULTURE, BETA STREP (GROUP B ONLY): Strep Gp B Culture: NEGATIVE

## 2021-09-26 ENCOUNTER — Other Ambulatory Visit: Payer: Self-pay

## 2021-09-26 ENCOUNTER — Other Ambulatory Visit: Payer: Self-pay | Admitting: Physician Assistant

## 2021-09-26 ENCOUNTER — Ambulatory Visit
Admission: RE | Admit: 2021-09-26 | Discharge: 2021-09-26 | Disposition: A | Discharge status: Home / Self Care | Payer: Self-pay | Source: Ambulatory Visit | Attending: Physician Assistant | Admitting: Physician Assistant

## 2021-09-26 DIAGNOSIS — O0990 Supervision of high risk pregnancy, unspecified, unspecified trimester: Secondary | ICD-10-CM | POA: Insufficient documentation

## 2021-09-26 DIAGNOSIS — O26843 Uterine size-date discrepancy, third trimester: Secondary | ICD-10-CM

## 2021-09-27 LAB — CHLAMYDIA/GC NAA, CONFIRMATION
Chlamydia trachomatis, NAA: NEGATIVE
Neisseria gonorrhoeae, NAA: NEGATIVE

## 2021-09-28 ENCOUNTER — Ambulatory Visit: Payer: Self-pay | Admitting: Physician Assistant

## 2021-09-28 ENCOUNTER — Encounter: Payer: Self-pay | Admitting: Physician Assistant

## 2021-09-28 ENCOUNTER — Other Ambulatory Visit: Payer: Self-pay

## 2021-09-28 VITALS — BP 111/71 | HR 81 | Temp 98.4°F | Wt 198.4 lb

## 2021-09-28 DIAGNOSIS — O0990 Supervision of high risk pregnancy, unspecified, unspecified trimester: Secondary | ICD-10-CM

## 2021-09-28 DIAGNOSIS — O0993 Supervision of high risk pregnancy, unspecified, third trimester: Secondary | ICD-10-CM

## 2021-09-28 DIAGNOSIS — O99210 Obesity complicating pregnancy, unspecified trimester: Secondary | ICD-10-CM

## 2021-09-28 DIAGNOSIS — O99212 Obesity complicating pregnancy, second trimester: Secondary | ICD-10-CM

## 2021-09-28 NOTE — Progress Notes (Signed)
[facility]  PRENATAL VISIT NOTE  Subjective:  Hannah Savage is a 25 y.o. G1P0000 at [redacted]w[redacted]d being seen today for ongoing prenatal care.  She is currently monitored for the following issues for this high-risk pregnancy and has Supervision of high-risk pregnancy, unspecified trimester; Obesity affecting pregnancy, antepartum; Class 1 obesity without serious comorbidity with body mass index (BMI) of 30.0 to 30.9 in adult; Positive depression screening; and Rubella non-immune status, antepartum on their problem list.  Patient reports no complaints.  Contractions: Not present. Vag. Bleeding: None.  Movement: Present. Denies leaking of fluid/ROM.   The following portions of the patient's history were reviewed and updated as appropriate: allergies, current medications, past family history, past medical history, past social history, past surgical history and problem list. Problem list updated.  Objective:   Vitals:   09/28/21 1450  BP: 129/74  Pulse: 81  Temp: 98.4 F (36.9 C)  Weight: 198 lb 6.4 oz (90 kg)    Fetal Status: Fetal Heart Rate (bpm): 124 Fundal Height: 35 cm Movement: Present  Presentation: Vertex  General:  Alert, oriented and cooperative. Patient is in no acute distress.  Skin: Skin is warm and dry. No rash noted.   Cardiovascular: Normal heart rate noted  Respiratory: Normal respiratory effort, no problems with respiration noted  Abdomen: Soft, gravid, appropriate for gestational age.  Pain/Pressure: Absent     Pelvic: Cervical exam deferred        Extremities: Normal range of motion.  Edema: None  Mental Status: Normal mood and affect. Normal behavior. Normal judgment and thought content.   Assessment and Plan:  Pregnancy: G1P0000 at [redacted]w[redacted]d  1. Supervision of high-risk pregnancy, unspecified trimester BP recheck 111/71. Doing well. Continue current care. Reviewed neg test results from prior visit with pt.  2.  Obesity affecting pregnancy, antepartum Appropriate interval and TWG. Continue to monitor.   Term labor symptoms and general obstetric precautions including but not limited to vaginal bleeding, contractions, leaking of fluid and fetal movement were reviewed in detail with the patient.  Due to language barrier, an interpreter (M. [place]) was present during the provider portion of the visit with this patient.  Please refer to After Visit Summary for other counseling recommendations.  Return in about 1 week (around 10/05/2021) for Routine prenatal care.  No future appointments.   Landry Dyke, PA-C

## 2021-09-28 NOTE — Addendum Note (Signed)
Addended by: Landry Dyke on: 09/28/2021 05:33 PM   Modules accepted: Orders

## 2021-09-28 NOTE — Progress Notes (Signed)
Reviewed 09/26/21 low AFI (<5%) result by phone with [facility] Dr. Huxley Vanwagoner Major. As AFI is borderline (between 5-58mm), he recommends repeat AFI tomorrow and if >41mm, continue weekly AFI. Plan to schedule AFI on 09/29/21 per his recommendation. Might need IOL if AFI < 85mm. Landry Dyke, PA-C

## 2021-09-29 ENCOUNTER — Telehealth: Payer: Self-pay

## 2021-09-29 ENCOUNTER — Ambulatory Visit
Admission: RE | Admit: 2021-09-29 | Discharge: 2021-09-29 | Disposition: A | Discharge status: Home / Self Care | Payer: Self-pay | Source: Ambulatory Visit | Attending: Physician Assistant | Admitting: Physician Assistant

## 2021-09-29 ENCOUNTER — Other Ambulatory Visit: Payer: Self-pay | Admitting: Family Medicine

## 2021-09-29 ENCOUNTER — Telehealth: Payer: Self-pay | Admitting: Family Medicine

## 2021-09-29 DIAGNOSIS — O0990 Supervision of high risk pregnancy, unspecified, unspecified trimester: Secondary | ICD-10-CM | POA: Insufficient documentation

## 2021-09-29 NOTE — Telephone Encounter (Signed)
Call to patient to confirm her awareness of ultrasound today at [facility] arrival time 2:30pm. Patient states she is aware and will be there.   Floy Sabina, RN

## 2021-09-29 NOTE — Telephone Encounter (Signed)
Call to [facility] scheduling and [place] with AFI scheduled for 10/05/2021 at 1300 (no available appt 10/04/21 and Elveria Rising FNP-BC aware).  Call to client with appt and voices concerns regarding payment for [place] and states a 20% pay to [facility]. Client to call back to clinic Monday if has additional questions / concerns. Jossie Ng, RN

## 2021-09-29 NOTE — Telephone Encounter (Signed)
Page to Dr, Edison Pace, MD [facility]) d/t patient 09/29/21 [place] AFI results was 6.2.    Dr. Jean Rosenthal was contacted following Dr. Tiburcio Pea recommendation of IOL if pt AFI was less than 7 after 09/26/21 AFI was 7.2.     Per Dr. Jean Rosenthal, pt does not need IOL at this time.  Reassess pt in 1 week and if AFI is 5 or less then pt will need IOL.    Pt is scheduled for repeat US with AFI for 10/05/21 at 1 pm.    RN contacted [place] scheduling for appointment.     Wendi Snipes, FNP

## 2021-09-29 NOTE — Telephone Encounter (Signed)
Call received from sonographer at [facility] that able to do AFI today (refer to A. Streilein PA-C note from 09/28/2021) and RN needs to call scheduling now for appt. Appt scheduled for 09/29/2021 with arrival time of 1430. Call to client with [facility] [employer] ID # [phone] and per recorded message, voicemail is not set up. Call then made to emergency contact who was counseled on need for / importance of [place] this pm and possibility of IOL today based on [place] results. Per contact, he will notify client of above and aware client may call clinic to discuss concerns / questions. Jossie Ng, RN

## 2021-10-05 ENCOUNTER — Telehealth: Payer: Self-pay

## 2021-10-05 ENCOUNTER — Other Ambulatory Visit: Payer: Self-pay

## 2021-10-05 ENCOUNTER — Ambulatory Visit
Admission: RE | Admit: 2021-10-05 | Discharge: 2021-10-05 | Disposition: A | Discharge status: Home / Self Care | Payer: Self-pay | Source: Ambulatory Visit | Attending: Family Medicine | Admitting: Family Medicine

## 2021-10-05 ENCOUNTER — Other Ambulatory Visit: Payer: Self-pay | Admitting: Obstetrics and Gynecology

## 2021-10-05 ENCOUNTER — Ambulatory Visit: Payer: Self-pay | Admitting: Advanced Practice Midwife

## 2021-10-05 VITALS — BP 121/69 | HR 79 | Temp 98.4°F | Wt 200.6 lb

## 2021-10-05 DIAGNOSIS — O99213 Obesity complicating pregnancy, third trimester: Secondary | ICD-10-CM

## 2021-10-05 DIAGNOSIS — O0990 Supervision of high risk pregnancy, unspecified, unspecified trimester: Secondary | ICD-10-CM | POA: Insufficient documentation

## 2021-10-05 DIAGNOSIS — O99210 Obesity complicating pregnancy, unspecified trimester: Secondary | ICD-10-CM

## 2021-10-05 DIAGNOSIS — O0993 Supervision of high risk pregnancy, unspecified, third trimester: Secondary | ICD-10-CM

## 2021-10-05 DIAGNOSIS — O4100X0 Oligohydramnios, unspecified trimester, not applicable or unspecified: Secondary | ICD-10-CM | POA: Insufficient documentation

## 2021-10-05 LAB — URINALYSIS
Bilirubin, UA: NEGATIVE
Glucose, UA: NEGATIVE
Leukocytes,UA: NEGATIVE
Nitrite, UA: NEGATIVE
RBC, UA: NEGATIVE
Specific Gravity, UA: 1.025 (ref 1.005–1.030)
Urobilinogen, Ur: 0.2 mg/dL (ref 0.2–1.0)
pH, UA: 7 (ref 5.0–7.5)

## 2021-10-05 MED ORDER — PRENATAL MULTIVITAMIN CH: 1.0000 | ORAL_TABLET | Freq: Every day | ORAL | 0 refills | Status: AC

## 2021-10-05 NOTE — Progress Notes (Signed)
  [facility] INDUCTION ASSESSMENT SCHEDULING Hannah Savage [birth year] Medical record #: [id number] Phone #:  Home Phone [phone]  Mobile [phone]    Prenatal Provider:[facility] Delivering Group:[facility] Proposed admission date/time:10/09/2021 at 0500 Method of induction:Cytotec  Weight:  200 lb  BMI  32.38 kg/m^2 HIV Negative HSV Negative EDC Estimated Date of Delivery: 10/27/22based on: U/S at [redacted]w[redacted]d  Gestational age on admission: [redacted]w[redacted]d Gravidity/parity:G1P0000  Cervix Score   0 1 2 3   Position Posterior Midposition Anterior   Consistency Firm Medium Soft   Effacement (%) 0-30 40-50 60-70 >80  Dilation (cm) Closed 1-2 3-4 >5  Baby's station -3 -2 -1 +1, +2  Unknown:    Bishop Score: unknown   Medical induction of labor:    Medical Indications Adapted from ACOG Committee Opinion #[id number], "Medically Indicated Late Preterm and Early Term Deliveries," 2013.  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 (41 weeks) Mo-Di isolated (32.0-34.6) Pregestational, vascular compromise (37.0- 39.0)  PPROM (34.0) Multiple Gestation Gestational, diet controlled (40.0)  Hx of IUFD (39.0 weeks) Di-Di (38.0-38.6) Gestational, med controlled (39.0)  Polyhydramnios, mild/moderate; SDV 8-16 or  AFI 25-35 (39.0) Mo-Di (36.0-37.6) Gestational, uncontrolled (38.0-39.0)  X Oligohydramnios (36.0-37.6); MVP <2 cm  For indications not listed above, delivery recommendations from maternal-fetal medicine consultant occurred on: Date:x with Dr. 03[birth year]1987 for indication of:x  Provider Signature: Juliann Pares Scheduled Thomasene Mohair and [place] Date:10/05/2021 9:48 PM   Call [phone] to finalize the induction date/time  [phone] (07/17)

## 2021-10-05 NOTE — Progress Notes (Signed)
[facility]  PRENATAL VISIT NOTE  Subjective:  Hannah Savage is a 25 y.o. G1P0000 at [redacted]w[redacted]d being seen today for ongoing prenatal care.  She is currently monitored for the following issues for this high-risk pregnancy and has Supervision of high-risk pregnancy, unspecified trimester; Obesity affecting pregnancy, antepartum; Class 1 obesity without serious comorbidity with body mass index (BMI) of 30.0 to 30.9 in adult; Positive depression screening; Rubella non-immune status, antepartum; and Oligohydramnios on 09/29/21 u/s @36  wks on their problem list.  Patient reports no complaints.  Contractions: Not present. Vag. Bleeding: None.  Movement: Present. Denies leaking of fluid/ROM.   The following portions of the patient's history were reviewed and updated as appropriate: allergies, current medications, past family history, past medical history, past social history, past surgical history and problem list. Problem list updated.  Objective:   Vitals:   10/05/21 1516  BP: 121/69  Pulse: 79  Temp: 98.4 F (36.9 C)  Weight: 200 lb 9.6 oz (91 kg)    Fetal Status: Fetal Heart Rate (bpm): 130 Fundal Height: 34 cm Movement: Present  Presentation: Vertex  General:  Alert, oriented and cooperative. Patient is in no acute distress.  Skin: Skin is warm and dry. No rash noted.   Cardiovascular: Normal heart rate noted  Respiratory: Normal respiratory effort, no problems with respiration noted  Abdomen: Soft, gravid, appropriate for gestational age.  Pain/Pressure: Absent     Pelvic: Cervical exam deferred        Extremities: Normal range of motion.  Edema: None  Mental Status: Normal mood and affect. Normal behavior. Normal judgment and thought content.   Assessment and Plan:  Pregnancy: G1P0000 at [redacted]w[redacted]d  1. Supervision of high-risk pregnancy, unspecified trimester Denies sxs depression. Has never seen [redacted]w[redacted]d, LCSW for counseling.   121/69; denies h/a, scotoma, epigastric pain. UA 1+ proteinuria. Denies sxs UTI. C&S today Has car seat and ready for baby at home. Knows when to go to L&D - Prenatal Vit-Fe Fumarate-FA (PRENATAL MULTIVITAMIN) TABS tablet; Take 1 tablet by mouth daily at 12 noon.  Dispense: 100 tablet; Refill: 0 - Urinalysis (Urine Dip) - Urine Culture  2. Obesity affecting pregnancy, antepartum 15 lb 9.6 oz (7.076 kg) Not taking ASA 81 mg  3. Oligohydramnios, antepartum, single or unspecified fetus 09/26/21 AFI=7.3, EFW=44%, anterior placenta, at 35 6/7 09/29/21 AFI=6.2 cm, anterior placenta at 36 4/7 wks 10/05/21 AFI=4.9 cm Consulted with Dr. 10/07/21 who requests I send a secure chat to him so he can review chart and u/s.  He will call pt and schedule IOL  - Urine Culture   Term labor symptoms and general obstetric precautions including but not limited to vaginal bleeding, contractions, leaking of fluid and fetal movement were reviewed in detail with the patient. Please refer to After Visit Summary for other counseling recommendations.  No follow-ups on file.  Future Appointments  Date Time Provider Department Center  10/10/2021  2:20 PM [facility] PROVIDER [facility] None    10/12/2021, CNM

## 2021-10-05 NOTE — Progress Notes (Signed)
PNV given to patient during this visit.   Urinalysis reviewed and urine culture ordered.   Appointment reminder card given to patient.   Floy Sabina, RN

## 2021-10-05 NOTE — Telephone Encounter (Signed)
[facility] and spoke with Selena Batten, their supervisor regarding the results of this patient's AFI as they are not finalized in Epic.   Per Selena Batten, results are as follow:   AFI = 4.9 at the 2.5 percentile and one 2.2 pocket.   Will pass this information to the provider during clinic visit today.   Floy Sabina, RN

## 2021-10-06 ENCOUNTER — Telehealth: Payer: Self-pay | Admitting: Obstetrics and Gynecology

## 2021-10-06 NOTE — Telephone Encounter (Signed)
Spoke by phone with patient regarding her diagnosis of oligohydramnios.  She has had a continued drop in her AFI over the past few weeks and given that her AFI is < 5 at this point, it makes sense to go ahead and get her induced. She was informed that her induction date is 10/17 at [facility]. She should go through the emergency department entrance and register at the desk and she will be taken to L&D from there. She was also informed that we will obtain an NST on Saturday, 10/15 at 10 am.  She should also go through the ER when she arrives to register and be taken to L&D for this test, where she will likely get the required COVID19 test.  She voiced understanding and agreement to the above. She was given the usual precautions of contractions, leaking fluid, vaginal bleeding, and decreased fetal movement. If she experiences any concerns in these areas, she should not wait for her appointed time. She should go directly to the hospital to have the issue investigated. She also voiced understanding and agreement with this recommendation.  The appointments we discussed have been cleared by [facility] L&D and are in their procedure book.   Thomasene Mohair, MD, [facility], [facility] 10/06/2021 8:46 AM     Call completed with: [employer]: [phone] - Gershon Cull (Spanish language interpreter)

## 2021-10-07 ENCOUNTER — Encounter: Payer: Self-pay | Admitting: Obstetrics & Gynecology

## 2021-10-07 ENCOUNTER — Other Ambulatory Visit: Payer: Self-pay

## 2021-10-07 ENCOUNTER — Observation Stay
Admission: EM | Admit: 2021-10-07 | Discharge: 2021-10-07 | Disposition: A | Discharge status: Home / Self Care | Payer: Self-pay | Attending: Obstetrics & Gynecology | Admitting: Obstetrics & Gynecology

## 2021-10-07 DIAGNOSIS — O4693 Antepartum hemorrhage, unspecified, third trimester: Secondary | ICD-10-CM | POA: Diagnosis present

## 2021-10-07 DIAGNOSIS — O0990 Supervision of high risk pregnancy, unspecified, unspecified trimester: Secondary | ICD-10-CM

## 2021-10-07 DIAGNOSIS — O09899 Supervision of other high risk pregnancies, unspecified trimester: Secondary | ICD-10-CM

## 2021-10-07 DIAGNOSIS — Z3A38 38 weeks gestation of pregnancy: Secondary | ICD-10-CM | POA: Insufficient documentation

## 2021-10-07 DIAGNOSIS — O4103X0 Oligohydramnios, third trimester, not applicable or unspecified: Principal | ICD-10-CM | POA: Insufficient documentation

## 2021-10-07 DIAGNOSIS — Z20822 Contact with and (suspected) exposure to covid-19: Secondary | ICD-10-CM | POA: Insufficient documentation

## 2021-10-07 DIAGNOSIS — O99210 Obesity complicating pregnancy, unspecified trimester: Secondary | ICD-10-CM

## 2021-10-07 DIAGNOSIS — O26853 Spotting complicating pregnancy, third trimester: Principal | ICD-10-CM | POA: Insufficient documentation

## 2021-10-07 DIAGNOSIS — O4100X0 Oligohydramnios, unspecified trimester, not applicable or unspecified: Secondary | ICD-10-CM | POA: Diagnosis present

## 2021-10-07 DIAGNOSIS — Z2839 Other underimmunization status: Secondary | ICD-10-CM

## 2021-10-07 LAB — URINALYSIS, ROUTINE W REFLEX MICROSCOPIC
Bilirubin Urine: NEGATIVE
Glucose, UA: NEGATIVE mg/dL
Ketones, ur: NEGATIVE mg/dL
Leukocytes,Ua: NEGATIVE
Nitrite: NEGATIVE
Protein, ur: NEGATIVE mg/dL
Specific Gravity, Urine: 1.004 — ABNORMAL LOW (ref 1.005–1.030)
pH: 7 (ref 5.0–8.0)

## 2021-10-07 LAB — WET PREP, GENITAL
Sperm: NONE SEEN
Trich, Wet Prep: NONE SEEN
Yeast Wet Prep HPF POC: NONE SEEN

## 2021-10-07 LAB — RESP PANEL BY RT-PCR (FLU A&B, COVID) ARPGX2
Influenza A by PCR: NEGATIVE
Influenza B by PCR: NEGATIVE
SARS Coronavirus 2 by RT PCR: NEGATIVE

## 2021-10-07 LAB — URINE CULTURE: Organism ID, Bacteria: NO GROWTH

## 2021-10-07 NOTE — OB Triage Note (Signed)

## 2021-10-07 NOTE — Final Progress Note (Signed)
Final Progress Note  Patient ID: Hannah Savage MRN: [id number] DOB/AGE: 07/27/1996 25 y.o.  Admit date: 10/07/2021 Admitting provider: Mirna Mires, CNM Discharge date: 10/07/2021   Admission Diagnoses: vaginal spotting in pregnancy   Discharge Diagnoses:  Active Problems:   Vaginal bleeding in pregnancy, third trimester  Reactive reassuring fetal heart tones  History of Present Illness: The patient is a 25 y.o. female G1P0000 at [redacted]w[redacted]d who presents for evaluation of scant vaginal spotting that she noticed at home this afternoon. She was seen on Labor and Delivery earlier today for a scheduled NST. Her pregnancy is marked by a diagnosis of oligohydramnios, for which she is scheduled for IOL in three days. She denies LOF, heavy bleeding and her baby is moving well. She denies any contractions or cramping.    Past Medical History:  Diagnosis Date   Patient denies medical problems     Past Surgical History:  Procedure Laterality Date   denies      No current facility-administered medications on file prior to encounter.   Current Outpatient Medications on File Prior to Encounter  Medication Sig Dispense Refill   Prenatal Vit-Fe Fumarate-FA (PRENATAL MULTIVITAMIN) TABS tablet Take 1 tablet by mouth daily at 12 noon. 100 tablet 0    No Known Allergies  Social History   Socioeconomic History   Marital status: Significant Other    Spouse name: Jose    Number of children: 0   Years of education: 9   Highest education level: 9th grade  Occupational History    Comment: "[employer]"  Tobacco Use   Smoking status: Never   Smokeless tobacco: Never   Tobacco comments:    Denies secondhand smoke   Vaping Use   Vaping Use: Never used  Substance and Sexual Activity   Alcohol use: Never   Drug use: Never   Sexual activity: Yes    Partners: Male    Birth control/protection: Condom, None, Injection    Comment: states depo inj due 12/21 but did not get   Other Topics Concern   Not on file  Social History Narrative   Not on file   Social Determinants of Health   Financial Resource Strain: Low Risk    Difficulty of Paying Living Expenses: Not hard at all  Food Insecurity: No Food Insecurity   Worried About Programme researcher, broadcasting/film/video in the Last Year: Never true   Ran Out of Food in the Last Year: Never true  Transportation Needs: No Transportation Needs   Lack of Transportation (Medical): No   Lack of Transportation (Non-Medical): No  Physical Activity: Not on file  Stress: Not on file  Social Connections: Not on file  Intimate Partner Violence: Not At Risk   Fear of Current or Ex-Partner: No   Emotionally Abused: No   Physically Abused: No   Sexually Abused: No    Family History  Problem Relation Age of Onset   Diabetes Mother    Heart disease Brother    Diabetes Maternal Grandmother      Review of Systems  Constitutional: Negative.   HENT: Negative.    Eyes: Negative.   Respiratory: Negative.    Cardiovascular: Negative.   Gastrointestinal: Negative.   Genitourinary: Negative.   Musculoskeletal: Negative.   Skin: Negative.   Neurological: Negative.   Endo/Heme/Allergies: Negative.   Psychiatric/Behavioral: Negative.      Physical Exam: BP 133/86 (BP Location: Right Arm)   Pulse 91   Temp 97.9 F (36.6 C) (Oral)  Resp 16   Ht 5\' 6"  (1.676 m)   Wt 90.7 kg   LMP 01/17/2021   BMI 32.28 kg/m   Physical Exam Constitutional:      Appearance: Normal appearance. She is obese.  Genitourinary:     Vulva normal.     Genitourinary Comments: SVE: FT/50%/-3. Scant bloody "show" noted.  HENT:     Head: Normocephalic.  Cardiovascular:     Rate and Rhythm: Normal rate and regular rhythm.  Pulmonary:     Effort: Pulmonary effort is normal.     Breath sounds: Normal breath sounds.  Abdominal:     Palpations: Abdomen is soft.  Musculoskeletal:        General: Normal range of motion.     Cervical back: Normal range of  motion and neck supple.  Neurological:     General: No focal deficit present.     Mental Status: She is alert and oriented to person, place, and time.  Skin:    General: Skin is warm and dry.  Psychiatric:        Mood and Affect: Mood normal.        Behavior: Behavior normal.    Consults: None  Significant Findings/ Diagnostic Studies: labs:  Results for orders placed or performed during the hospital encounter of 10/07/21 (from the past 24 hour(s))  Wet prep, genital     Status: Abnormal   Collection Time: 10/07/21  9:18 PM  Result Value Ref Range   Yeast Wet Prep HPF POC NONE SEEN NONE SEEN   Trich, Wet Prep NONE SEEN NONE SEEN   Clue Cells Wet Prep HPF POC PRESENT (A) NONE SEEN   WBC, Wet Prep HPF POC FEW (A) NONE SEEN   Sperm NONE SEEN   Urinalysis, Routine w reflex microscopic     Status: Abnormal   Collection Time: 10/07/21  9:18 PM  Result Value Ref Range   Color, Urine STRAW (A) YELLOW   APPearance HAZY (A) CLEAR   Specific Gravity, Urine 1.004 (L) 1.005 - 1.030   pH 7.0 5.0 - 8.0   Glucose, UA NEGATIVE NEGATIVE mg/dL   Hgb urine dipstick LARGE (A) NEGATIVE   Bilirubin Urine NEGATIVE NEGATIVE   Ketones, ur NEGATIVE NEGATIVE mg/dL   Protein, ur NEGATIVE NEGATIVE mg/dL   Nitrite NEGATIVE NEGATIVE   Leukocytes,Ua NEGATIVE NEGATIVE   RBC / HPF 0-5 0 - 5 RBC/hpf   WBC, UA 0-5 0 - 5 WBC/hpf   Bacteria, UA RARE (A) NONE SEEN   Squamous Epithelial / LPF 0-5 0 - 5   Mucus PRESENT      Procedures:  EFM NST Baseline FHR: 125 beats/min Variability: moderate Accelerations: present Decelerations: absent Tocometry: no regular contractions  Interpretation:  INDICATIONS: vaginal bleeding RESULTS:  A NST procedure was performed with FHR monitoring and a normal baseline established, appropriate time of 20-40 minutes of evaluation, and accels >2 seen w 15x15 characteristics.  Results show a REACTIVE NST.    Hospital Course: The patient was admitted to Labor and Delivery  Triage for observation. Her labor was monitored with the EFM, and the fhts were reactive, with numerous accels, moderate variability and no decels. A cervical exam showed limited dilation, and some very scant show was noted with the exam. With a reassuring FHT and no signs of labor, she is discharged home with instructions to return for any heavy bleeding, decreased fetal movement or regular , painful contractions.  Discharge Condition: good  Disposition: Discharge disposition: 01-Home or Self  Care       Diet: Regular diet  Discharge Activity: Activity as tolerated  Discharge Instructions     Fetal Kick Count:  Lie on our left side for one hour after a meal, and count the number of times your baby kicks.  If it is less than 5 times, get up, move around and drink some juice.  Repeat the test 30 minutes later.  If it is still less than 5 kicks in an hour, notify your doctor.   Complete by: As directed    LABOR:  When conractions begin, you should start to time them from the beginning of one contraction to the beginning  of the next.  When contractions are 5 - 10 minutes apart or less and have been regular for at least an hour, you should call your health care provider.   Complete by: As directed    Notify physician for bleeding from the vagina   Complete by: As directed    Notify physician for blurring of vision or spots before the eyes   Complete by: As directed    Notify physician for chills or fever   Complete by: As directed    Notify physician for fainting spells, "black outs" or loss of consciousness   Complete by: As directed    Notify physician for increase in vaginal discharge   Complete by: As directed    Notify physician for leaking of fluid   Complete by: As directed    Notify physician for pain or burning when urinating   Complete by: As directed    Notify physician for pelvic pressure (sudden increase)   Complete by: As directed    Notify physician for severe or  continued nausea or vomiting   Complete by: As directed    Notify physician for sudden gushing of fluid from the vagina (with or without continued leaking)   Complete by: As directed    Notify physician for sudden, constant, or occasional abdominal pain   Complete by: As directed    Notify physician if baby moving less than usual   Complete by: As directed       Allergies as of 10/07/2021   No Known Allergies      Medication List     TAKE these medications    prenatal multivitamin Tabs tablet Take 1 tablet by mouth daily at 12 noon.         Total time spent taking care of this patient: 40 minutes  Signed: Mirna Mires, CNM  10/07/2021, 10:34 PM

## 2021-10-07 NOTE — OB Triage Note (Signed)
Pt Hannah Savage 25 y.o. presents to labor and delivery triage reporting vaginal bleeding and pain with urination. Pt. Denies sexual intercourse or cervical exams within the last 24-48 hours. Pt is a G1P0000 at [redacted]w[redacted]d . Pt denies signs and symptoms consistent with rupture of membranes. Pt denies contractions and states positive fetal movement. External FM and TOCO applied to non-tender abdomen and assessing. Initial FHR 150. Vital signs obtained and within normal limits. Provider notified of pt.

## 2021-10-07 NOTE — OB Triage Note (Signed)
Is a G1P0 at [redacted]w[redacted]d that presents for scheduled NST. Pt states positive FM, Denies LOF, VB.

## 2021-10-07 NOTE — Discharge Summary (Signed)
    Ms Hannah Savage presents for a Non Stress test. She is a 25 year old Gravida 1 para 0, now 38 weeks 2 days gestation, who has a diagnosis of oligohydramnios. She has received care at the [facility], and she is already scheduled for IOL in three days. Per MFM she was set up for NST today.  She reports active fetal movement, denies any contractions , LOF, or vaginal bleeding.  Baseline: 125 Variability: moderate Accelerations: present Decelerations: absent Tocometry: none The patient was monitored for 30 minutes, fetal heart rate tracing was deemed to be reactive, category I tracing,, and she was thus discharged home with plans for return to Labor and Delivery in several days for her scheduled Induction.  Mirna Mires, CNM  10/07/2021 10:57 PM    CPT [phone]

## 2021-10-07 NOTE — OB Triage Note (Signed)
Patient Discharged home per provider. Pt educated about labor precautions and informed when to return to the ED for further evaluation. Pt instructed to arrive for scheduled IOL at 0500 Monday morning. AVS given to patient and RN answered all questions and patient has no further questions at this time. Pt discharged home in stable condition with significant other.

## 2021-10-07 NOTE — Discharge Summary (Signed)
  Please see Final progress Note.  Mirna Mires, CNM  10/07/2021 10:33 PM

## 2021-10-09 ENCOUNTER — Inpatient Hospital Stay: Payer: Medicaid Other | Admitting: Certified Registered Nurse Anesthetist

## 2021-10-09 ENCOUNTER — Encounter
Admission: EM | Disposition: A | Discharge status: Home / Self Care | Payer: Self-pay | Source: Home / Self Care | Attending: Obstetrics and Gynecology

## 2021-10-09 ENCOUNTER — Inpatient Hospital Stay
Admission: EM | Admit: 2021-10-09 | Discharge: 2021-10-12 | DRG: 786 | Disposition: A | Discharge status: Home / Self Care | Payer: Medicaid Other | Attending: Obstetrics and Gynecology | Admitting: Obstetrics and Gynecology

## 2021-10-09 ENCOUNTER — Encounter: Payer: Self-pay | Admitting: Obstetrics & Gynecology

## 2021-10-09 ENCOUNTER — Other Ambulatory Visit: Payer: Self-pay

## 2021-10-09 DIAGNOSIS — F32A Depression, unspecified: Secondary | ICD-10-CM | POA: Diagnosis present

## 2021-10-09 DIAGNOSIS — O99344 Other mental disorders complicating childbirth: Secondary | ICD-10-CM | POA: Diagnosis present

## 2021-10-09 DIAGNOSIS — Z3A38 38 weeks gestation of pregnancy: Secondary | ICD-10-CM

## 2021-10-09 DIAGNOSIS — O99214 Obesity complicating childbirth: Secondary | ICD-10-CM | POA: Diagnosis present

## 2021-10-09 DIAGNOSIS — Z23 Encounter for immunization: Secondary | ICD-10-CM | POA: Diagnosis not present

## 2021-10-09 DIAGNOSIS — O9902 Anemia complicating childbirth: Secondary | ICD-10-CM | POA: Diagnosis present

## 2021-10-09 DIAGNOSIS — D509 Iron deficiency anemia, unspecified: Secondary | ICD-10-CM | POA: Diagnosis present

## 2021-10-09 DIAGNOSIS — O411230 Chorioamnionitis, third trimester, not applicable or unspecified: Secondary | ICD-10-CM | POA: Diagnosis present

## 2021-10-09 DIAGNOSIS — O1404 Mild to moderate pre-eclampsia, complicating childbirth: Secondary | ICD-10-CM | POA: Diagnosis present

## 2021-10-09 DIAGNOSIS — O99343 Other mental disorders complicating pregnancy, third trimester: Secondary | ICD-10-CM

## 2021-10-09 DIAGNOSIS — O4103X0 Oligohydramnios, third trimester, not applicable or unspecified: Secondary | ICD-10-CM | POA: Diagnosis present

## 2021-10-09 DIAGNOSIS — O99213 Obesity complicating pregnancy, third trimester: Secondary | ICD-10-CM

## 2021-10-09 DIAGNOSIS — O1403 Mild to moderate pre-eclampsia, third trimester: Secondary | ICD-10-CM | POA: Diagnosis present

## 2021-10-09 HISTORY — DX: Mild to moderate pre-eclampsia, third trimester: O14.03

## 2021-10-09 LAB — CBC
HCT: 36.8 % (ref 36.0–46.0)
Hemoglobin: 13 g/dL (ref 12.0–15.0)
MCH: 33.2 pg (ref 26.0–34.0)
MCHC: 35.3 g/dL (ref 30.0–36.0)
MCV: 93.9 fL (ref 80.0–100.0)
Platelets: 131 10*3/uL — ABNORMAL LOW (ref 150–400)
RBC: 3.92 MIL/uL (ref 3.87–5.11)
RDW: 13 % (ref 11.5–15.5)
WBC: 11.4 10*3/uL — ABNORMAL HIGH (ref 4.0–10.5)
nRBC: 0 % (ref 0.0–0.2)

## 2021-10-09 LAB — RPR: RPR Ser Ql: NONREACTIVE

## 2021-10-09 LAB — PROTEIN / CREATININE RATIO, URINE
Creatinine, Urine: 55 mg/dL
Protein Creatinine Ratio: 0.29 mg/mg{Cre} — ABNORMAL HIGH (ref 0.00–0.15)
Total Protein, Urine: 16 mg/dL

## 2021-10-09 LAB — ABO/RH: ABO/RH(D): O POS

## 2021-10-09 SURGERY — Surgical Case
Anesthesia: Epidural

## 2021-10-09 MED ORDER — LIDOCAINE HCL (PF) 1 % IJ SOLN: 30.0000 mL | INTRAMUSCULAR | Status: DC | PRN

## 2021-10-09 MED ORDER — FENTANYL-BUPIVACAINE-NACL 0.5-0.125-0.9 MG/250ML-% EP SOLN
EPIDURAL | Status: AC
  Filled 2021-10-09: qty 250

## 2021-10-09 MED ORDER — CLINDAMYCIN PHOSPHATE 900 MG/50ML IV SOLN
INTRAVENOUS | Status: AC
  Administered 2021-10-09: 900 mg
  Filled 2021-10-09: qty 50

## 2021-10-09 MED ORDER — BUPIVACAINE HCL (PF) 0.5 % IJ SOLN
INTRAMUSCULAR | Status: DC | PRN
  Administered 2021-10-09: 10 mL

## 2021-10-09 MED ORDER — LIDOCAINE-EPINEPHRINE (PF) 1.5 %-1:200000 IJ SOLN
INTRAMUSCULAR | Status: DC | PRN
  Administered 2021-10-09: 3 mL via PERINEURAL

## 2021-10-09 MED ORDER — FENTANYL CITRATE (PF) 100 MCG/2ML IJ SOLN
INTRAMUSCULAR | Status: AC
  Filled 2021-10-09: qty 2

## 2021-10-09 MED ORDER — TERBUTALINE SULFATE 1 MG/ML IJ SOLN: 0.2500 mg | Freq: Once | INTRAMUSCULAR | Status: DC | PRN

## 2021-10-09 MED ORDER — MISOPROSTOL 25 MCG QUARTER TABLET
25.0000 ug | ORAL_TABLET | ORAL | Status: DC | PRN
  Administered 2021-10-09: 25 ug via VAGINAL
  Filled 2021-10-09 (×2): qty 1

## 2021-10-09 MED ORDER — ONDANSETRON HCL 4 MG/2ML IJ SOLN
INTRAMUSCULAR | Status: DC | PRN
  Administered 2021-10-09: 4 mg via INTRAVENOUS

## 2021-10-09 MED ORDER — LIDOCAINE HCL 2 % IJ SOLN
INTRAMUSCULAR | Status: AC
  Filled 2021-10-09: qty 20

## 2021-10-09 MED ORDER — LIDOCAINE 2% (20 MG/ML) 5 ML SYRINGE: INTRAMUSCULAR | Status: DC | PRN

## 2021-10-09 MED ORDER — ONDANSETRON HCL 4 MG/2ML IJ SOLN: 4.0000 mg | Freq: Four times a day (QID) | INTRAMUSCULAR | Status: DC | PRN

## 2021-10-09 MED ORDER — LACTATED RINGERS IV SOLN: 500.0000 mL | INTRAVENOUS | Status: DC | PRN

## 2021-10-09 MED ORDER — LIDOCAINE HCL (PF) 1 % IJ SOLN
INTRAMUSCULAR | Status: AC
  Filled 2021-10-09: qty 30

## 2021-10-09 MED ORDER — OXYTOCIN 10 UNIT/ML IJ SOLN
INTRAMUSCULAR | Status: AC
  Filled 2021-10-09: qty 1

## 2021-10-09 MED ORDER — BUTORPHANOL TARTRATE 1 MG/ML IJ SOLN
1.0000 mg | INTRAMUSCULAR | Status: DC | PRN
Start: 2021-10-09 — End: 2021-10-10

## 2021-10-09 MED ORDER — OXYTOCIN-SODIUM CHLORIDE 30-0.9 UT/500ML-% IV SOLN: 2.5000 [IU]/h | INTRAVENOUS | Status: DC

## 2021-10-09 MED ORDER — CLINDAMYCIN PHOSPHATE 900 MG/50ML IV SOLN
INTRAVENOUS | Status: AC
  Filled 2021-10-09: qty 50

## 2021-10-09 MED ORDER — KETAMINE HCL 50 MG/ML IJ SOLN
INTRAMUSCULAR | Status: AC
  Filled 2021-10-09: qty 10

## 2021-10-09 MED ORDER — ACETAMINOPHEN 325 MG PO TABS
650.0000 mg | ORAL_TABLET | ORAL | Status: DC | PRN
  Administered 2021-10-09: 650 mg via ORAL
  Filled 2021-10-09: qty 2

## 2021-10-09 MED ORDER — OXYCODONE-ACETAMINOPHEN 5-325 MG PO TABS: 1.0000 | ORAL_TABLET | ORAL | Status: DC | PRN

## 2021-10-09 MED ORDER — SODIUM CHLORIDE 0.9 % IV SOLN: 2.0000 g | Freq: Four times a day (QID) | INTRAVENOUS | Status: DC

## 2021-10-09 MED ORDER — KETOROLAC TROMETHAMINE 30 MG/ML IJ SOLN
INTRAMUSCULAR | Status: AC
  Filled 2021-10-09: qty 1

## 2021-10-09 MED ORDER — 0.9 % SODIUM CHLORIDE (POUR BTL) OPTIME
TOPICAL | Status: DC | PRN
  Administered 2021-10-09: 1000 mL

## 2021-10-09 MED ORDER — OXYTOCIN-SODIUM CHLORIDE 30-0.9 UT/500ML-% IV SOLN
1.0000 m[IU]/min | INTRAVENOUS | Status: DC
  Filled 2021-10-09: qty 500

## 2021-10-09 MED ORDER — CLINDAMYCIN PHOSPHATE 900 MG/50ML IV SOLN: 900.0000 mg | INTRAVENOUS | Status: DC

## 2021-10-09 MED ORDER — BUPIVACAINE HCL (PF) 0.25 % IJ SOLN
INTRAMUSCULAR | Status: DC | PRN
  Administered 2021-10-09: 4 mL via EPIDURAL
  Administered 2021-10-09: 2 mL via EPIDURAL

## 2021-10-09 MED ORDER — CLINDAMYCIN PHOSPHATE 900 MG/50ML IV SOLN: 900.0000 mg | Freq: Three times a day (TID) | INTRAVENOUS | Status: DC

## 2021-10-09 MED ORDER — LACTATED RINGERS IV SOLN: INTRAVENOUS | Status: DC

## 2021-10-09 MED ORDER — GENTAMICIN SULFATE 40 MG/ML IJ SOLN
5.0000 mg/kg | INTRAVENOUS | Status: DC
  Filled 2021-10-09: qty 11.25

## 2021-10-09 MED ORDER — KETOROLAC TROMETHAMINE 30 MG/ML IJ SOLN
INTRAMUSCULAR | Status: DC | PRN
  Administered 2021-10-09: 30 mg via INTRAVENOUS

## 2021-10-09 MED ORDER — FENTANYL CITRATE (PF) 100 MCG/2ML IJ SOLN
INTRAMUSCULAR | Status: DC | PRN
  Administered 2021-10-09 (×4): 50 ug via INTRAVENOUS

## 2021-10-09 MED ORDER — CEFAZOLIN SODIUM-DEXTROSE 2-4 GM/100ML-% IV SOLN
INTRAVENOUS | Status: AC
  Administered 2021-10-09: 2000 mg
  Filled 2021-10-09: qty 100

## 2021-10-09 MED ORDER — AMMONIA AROMATIC IN INHA
RESPIRATORY_TRACT | Status: AC
  Filled 2021-10-09: qty 10

## 2021-10-09 MED ORDER — OXYCODONE-ACETAMINOPHEN 5-325 MG PO TABS: 2.0000 | ORAL_TABLET | ORAL | Status: DC | PRN

## 2021-10-09 MED ORDER — LIDOCAINE HCL (PF) 1 % IJ SOLN
INTRAMUSCULAR | Status: DC | PRN
  Administered 2021-10-09: 2 mL

## 2021-10-09 MED ORDER — MIDAZOLAM HCL 2 MG/2ML IJ SOLN
INTRAMUSCULAR | Status: AC
  Filled 2021-10-09: qty 2

## 2021-10-09 MED ORDER — OXYTOCIN BOLUS FROM INFUSION: 333.0000 mL | Freq: Once | INTRAVENOUS | Status: DC

## 2021-10-09 MED ORDER — SOD CITRATE-CITRIC ACID 500-334 MG/5ML PO SOLN: 30.0000 mL | ORAL | Status: DC | PRN

## 2021-10-09 MED ORDER — MIDAZOLAM HCL 2 MG/2ML IJ SOLN
INTRAMUSCULAR | Status: DC | PRN
  Administered 2021-10-09 (×2): 2 mg via INTRAVENOUS

## 2021-10-09 MED ORDER — LIDOCAINE HCL (PF) 2 % IJ SOLN
INTRAMUSCULAR | Status: DC | PRN
  Administered 2021-10-09: 100 mg via EPIDURAL
  Administered 2021-10-09 (×2): 60 mg via EPIDURAL
  Administered 2021-10-09: 100 mg via EPIDURAL

## 2021-10-09 MED ORDER — OXYTOCIN-SODIUM CHLORIDE 30-0.9 UT/500ML-% IV SOLN
INTRAVENOUS | Status: DC | PRN
  Administered 2021-10-09: 30 [IU] via INTRAVENOUS

## 2021-10-09 MED ORDER — OXYTOCIN-SODIUM CHLORIDE 30-0.9 UT/500ML-% IV SOLN
INTRAVENOUS | Status: AC
  Administered 2021-10-09: 2 m[IU]/min via INTRAVENOUS
  Filled 2021-10-09: qty 500

## 2021-10-09 MED ORDER — FENTANYL-BUPIVACAINE-NACL 0.5-0.125-0.9 MG/250ML-% EP SOLN
EPIDURAL | Status: DC | PRN
  Administered 2021-10-09: 12 mL/h via EPIDURAL

## 2021-10-09 MED ORDER — KETAMINE HCL 10 MG/ML IJ SOLN
INTRAMUSCULAR | Status: DC | PRN
  Administered 2021-10-09: 20 mg via INTRAVENOUS
  Administered 2021-10-09: 10 mg via INTRAVENOUS
  Administered 2021-10-09: 20 mg via INTRAVENOUS

## 2021-10-09 MED ORDER — SODIUM CHLORIDE 0.9 % IV SOLN
INTRAVENOUS | Status: AC
  Filled 2021-10-09: qty 2000

## 2021-10-09 MED ORDER — MORPHINE SULFATE (PF) 0.5 MG/ML IJ SOLN
INTRAMUSCULAR | Status: DC | PRN
  Administered 2021-10-09: 3 mg via EPIDURAL

## 2021-10-09 MED ORDER — MORPHINE SULFATE (PF) 0.5 MG/ML IJ SOLN
INTRAMUSCULAR | Status: AC
  Filled 2021-10-09: qty 10

## 2021-10-09 MED ORDER — ONDANSETRON HCL 4 MG/2ML IJ SOLN
INTRAMUSCULAR | Status: AC
  Administered 2021-10-09: 4 mg
  Filled 2021-10-09: qty 2

## 2021-10-09 MED ORDER — SOD CITRATE-CITRIC ACID 500-334 MG/5ML PO SOLN
30.0000 mL | ORAL | Status: AC
  Administered 2021-10-09: 30 mL via ORAL

## 2021-10-09 MED ORDER — BUPIVACAINE HCL (PF) 0.5 % IJ SOLN
INTRAMUSCULAR | Status: AC
  Filled 2021-10-09: qty 10

## 2021-10-09 MED ORDER — CHLOROPROCAINE HCL (PF) 3 % IJ SOLN
INTRAMUSCULAR | Status: AC
  Filled 2021-10-09: qty 20

## 2021-10-09 MED ORDER — SOD CITRATE-CITRIC ACID 500-334 MG/5ML PO SOLN
ORAL | Status: AC
  Filled 2021-10-09: qty 15

## 2021-10-09 MED ORDER — CEFAZOLIN SODIUM-DEXTROSE 2-4 GM/100ML-% IV SOLN: 2.0000 g | Freq: Once | INTRAVENOUS | Status: DC

## 2021-10-09 MED ORDER — METHYLERGONOVINE MALEATE 0.2 MG/ML IJ SOLN
INTRAMUSCULAR | Status: AC
  Filled 2021-10-09: qty 1

## 2021-10-09 MED ORDER — MISOPROSTOL 200 MCG PO TABS
ORAL_TABLET | ORAL | Status: AC
  Filled 2021-10-09: qty 4

## 2021-10-09 SURGICAL SUPPLY — 32 items
CELL SAVER LIPIGURD (MISCELLANEOUS) ×1 IMPLANT
CHLORAPREP W/TINT 26 (MISCELLANEOUS) ×4 IMPLANT
DERMABOND ADVANCED (GAUZE/BANDAGES/DRESSINGS) ×1
DERMABOND ADVANCED .7 DNX12 (GAUZE/BANDAGES/DRESSINGS) ×1 IMPLANT
DRESSING SURGICEL FIBRLLR 1X2 (HEMOSTASIS) ×1 IMPLANT
DRSG OPSITE POSTOP 4X10 (GAUZE/BANDAGES/DRESSINGS) ×2 IMPLANT
DRSG SURGICEL FIBRILLAR 1X2 (HEMOSTASIS) ×2
DRSG TELFA 3X8 NADH (GAUZE/BANDAGES/DRESSINGS) ×4 IMPLANT
ELECT CAUTERY BLADE 6.4 (BLADE) ×2 IMPLANT
ELECT REM PT RETURN 9FT ADLT (ELECTROSURGICAL) ×2
ELECTRODE REM PT RTRN 9FT ADLT (ELECTROSURGICAL) ×1 IMPLANT
EXTRT SYSTEM ALEXIS 14CM (MISCELLANEOUS) ×2
GLOVE SURG UNDER POLY LF SZ6.5 (GLOVE) ×4 IMPLANT
GOWN STRL REUS W/ TWL LRG LVL3 (GOWN DISPOSABLE) ×1 IMPLANT
GOWN STRL REUS W/ TWL XL LVL3 (GOWN DISPOSABLE) ×2 IMPLANT
GOWN STRL REUS W/TWL LRG LVL3 (GOWN DISPOSABLE) ×1
GOWN STRL REUS W/TWL XL LVL3 (GOWN DISPOSABLE) ×2
MANIFOLD NEPTUNE II (INSTRUMENTS) ×2 IMPLANT
MAT PREVALON FULL STRYKER (MISCELLANEOUS) ×2 IMPLANT
NS IRRIG 1000ML POUR BTL (IV SOLUTION) ×2 IMPLANT
PACK C SECTION AR (MISCELLANEOUS) ×2 IMPLANT
PAD OB MATERNITY 4.3X12.25 (PERSONAL CARE ITEMS) ×2 IMPLANT
PAD PREP 24X41 OB/GYN DISP (PERSONAL CARE ITEMS) ×2 IMPLANT
PENCIL SMOKE EVACUATOR (MISCELLANEOUS) ×2 IMPLANT
SCRUB EXIDINE 4% CHG 4OZ (MISCELLANEOUS) ×2 IMPLANT
SUT MNCRL AB 4-0 PS2 18 (SUTURE) ×2 IMPLANT
SUT PLAIN 3-0 (SUTURE) ×2 IMPLANT
SUT VIC AB 0 CT1 36 (SUTURE) ×8 IMPLANT
SUT VIC AB 2-0 CT1 36 (SUTURE) ×2 IMPLANT
SYR 30ML LL (SYRINGE) ×4 IMPLANT
TUNNLER SHEATHS 12IN 11GA ON-Q (MISCELLANEOUS) ×4 IMPLANT
WATER STERILE IRR 500ML POUR (IV SOLUTION) ×2 IMPLANT

## 2021-10-09 NOTE — Progress Notes (Signed)
  Labor Progress Note   25 y.o. G1P0000 @ [redacted]w[redacted]d , admitted for  Pregnancy, Labor Management. oligohydramnios  Subjective:  Comfortable with epidural  Objective:  BP (!) 146/103   Pulse (!) 114   Temp 98 F (36.7 C) (Oral)   Resp 18   Ht 5\' 6"  (1.676 m)   Wt 90.7 kg   LMP 01/17/2021   SpO2 98%   BMI 32.27 kg/m  Abd: gravid, ND, FHT present, mild tenderness on exam Extr: no edema SVE: CERVIX: 4 cm dilated, 80 effaced, -2 station AROM scant, clear/pink  EFM: FHR: 130 bpm, variability: moderate,  accelerations:  Present,  decelerations:  variable after AROM Toco: Frequency: difficult to trace, consider IUPC at next check Labs: I have reviewed the patient's lab results.   Assessment & Plan:  G1P0000 @ [redacted]w[redacted]d, admitted for  Pregnancy and Labor/Delivery Management  1. Pain management: epidural. 2. FWB: FHT category II and overall reassuring.  3. ID: GBS negative 4. Labor management: Start pitocin as needed  All discussed with patient, see orders   [redacted]w[redacted]d, CNM [facility] 10/09/2021  3:26 PM

## 2021-10-09 NOTE — Progress Notes (Signed)
  Labor Progress Note  Interpreter present (electronic)  25 y.o. G1P0000 @ [redacted]w[redacted]d , admitted for  Pregnancy, Labor Management. oligohydramnios  Subjective:  Patient rates pain at 5/10 with contractions  Objective:  BP (!) 147/82 (BP Location: Right Arm)   Pulse 66   Temp 98 F (36.7 C) (Oral)   Resp 18   Ht 5\' 6"  (1.676 m)   Wt 90.7 kg   LMP 01/17/2021   BMI 32.27 kg/m  Abd: gravid, ND, FHT present, mild tenderness on exam Extr: no edema SVE: CERVIX: 3 cm dilated, 80 effaced, -2 station Cervical sweep  EFM: FHR: 135 bpm, variability: moderate,  accelerations:  Present,  decelerations:  Absent Toco: Frequency: Every 3-4 minutes Labs: I have reviewed the patient's lab results.   Assessment & Plan:  G1P0000 @ [redacted]w[redacted]d, admitted for  Pregnancy and Labor/Delivery Management  1. Pain management:  position changes . Plans epidural 2. FWB: FHT category Category I.  3. ID: GBS negative 4. Labor management: start pitocin as needed, consider AROM after epidural  All discussed with patient, see orders   [redacted]w[redacted]d, CNM [facility] 10/09/2021  11:29 AM

## 2021-10-09 NOTE — Transfer of Care (Signed)
Immediate Anesthesia Transfer of Care Note  Patient: Hannah Savage  Procedure(s) Performed: CESAREAN SECTION  Patient Location: PACU and Mother/Baby  Anesthesia Type:Epidural  Level of Consciousness: drowsy and patient cooperative  Airway & Oxygen Therapy: Patient Spontanous Breathing  Post-op Assessment: Report given to RN and Post -op Vital signs reviewed and stable  Post vital signs: Reviewed and stable  Last Vitals:  Vitals Value Taken Time  BP 127/83 10/09/2021 2356  Temp    Pulse 80   Resp 18   SpO2 98     Last Pain:  Vitals:   10/09/21 2037  TempSrc: Axillary  PainSc:          Complications: No notable events documented.

## 2021-10-09 NOTE — Progress Notes (Signed)
Evaluated patient's fetal heart rate tracing from home. Patient noted to have tachycardia with recurrent late decelerations. This was present since pushing commenced at 8 PM. Discussed with Tresea Mall the situation. She was noted to have a fever while pushing. Tylenol and antibiotics were started. Advised for a pushing break. Came to access patient for a VAVD. Pitocin was discontinued. SVE 10/100/+2, caput noted. After patient gave consent of VAVD attempt, Tresea Mall performed 1 pull with significant bradycardia occurring after the pull for 6 minutes. Advised patient that a cesarean section would be needed. Discussed risks of the cesarean section and consented patient.   Spanish interpreter services used to communicate with the patient.   Adelene Idler MD, [facility], [facility] 10/09/2021 9:52 PM

## 2021-10-09 NOTE — Progress Notes (Signed)
  Labor Progress Note   25 y.o. G1P0000 @ [redacted]w[redacted]d , admitted for  Pregnancy, Labor Management. oligohydramnios  Subjective:  Comfortable with epidural  Objective:  BP 132/82 (BP Location: Right Arm)   Pulse 86   Temp 98.4 F (36.9 C) (Oral)   Resp 18   Ht 5\' 6"  (1.676 m)   Wt 90.7 kg   LMP 01/17/2021   SpO2 100%   BMI 32.27 kg/m  Abd: gravid, ND, FHT present, mild tenderness on exam Extr: no edema SVE: CERVIX: 4.5 cm dilated, 80 effaced, -2 station IUPC placed  EFM: FHR: 130 bpm, variability: moderate,  accelerations:  Present,  decelerations:  Absent Toco: Frequency: Every 3-7 minutes Labs: I have reviewed the patient's lab results.   Assessment & Plan:  G1P0000 @ [redacted]w[redacted]d, admitted for  Pregnancy and Labor/Delivery Management  1. Pain management: epidural. 2. FWB: FHT category I.  3. ID: GBS negative 4. Labor management: start Pitocin  All discussed with patient, see orders   [redacted]w[redacted]d, CNM [facility] 10/09/2021  4:50 PM

## 2021-10-09 NOTE — Anesthesia Preprocedure Evaluation (Addendum)
Anesthesia Evaluation  Patient identified by MRN, date of birth, ID band Patient awake    Reviewed: Allergy & Precautions, H&P , NPO status , Patient's Chart, lab work & pertinent test results  Airway Mallampati: I   Neck ROM: full    Dental no notable dental hx.    Pulmonary neg pulmonary ROS,    Pulmonary exam normal        Cardiovascular negative cardio ROS Normal cardiovascular exam     Neuro/Psych negative neurological ROS  negative psych ROS   GI/Hepatic negative GI ROS, Neg liver ROS,   Endo/Other  negative endocrine ROS  Renal/GU negative Renal ROS  negative genitourinary   Musculoskeletal   Abdominal   Peds  Hematology negative hematology ROS (+)   Anesthesia Other Findings   Reproductive/Obstetrics (+) Pregnancy                             Anesthesia Physical Anesthesia Plan  ASA: 2  Anesthesia Plan: Epidural   Post-op Pain Management:    Induction:   PONV Risk Score and Plan:   Airway Management Planned:   Additional Equipment:   Intra-op Plan:   Post-operative Plan:   Informed Consent: I have reviewed the patients History and Physical, chart, labs and discussed the procedure including the risks, benefits and alternatives for the proposed anesthesia with the patient or authorized representative who has indicated his/her understanding and acceptance.       Plan Discussed with: CRNA and Anesthesiologist  Anesthesia Plan Comments: (Update: Pt was consented for cesarean delivery by epidural with back-up GA.)       Anesthesia Quick Evaluation

## 2021-10-09 NOTE — H&P (Signed)
OB History & Physical   History of Present Illness:  Chief Complaint: IOL for Oligohydramnios  HPI:  Hannah Savage is a 25 y.o. G1P0000 female at 32w4ddated by 19 week ultrasound.  Her pregnancy has been complicated by obesity, depression, rubella non-immune, oligohydramnios .    She denies contractions.   She denies leakage of fluid.   She denies vaginal bleeding.   She reports fetal movement.    Total weight gain for pregnancy: 6.804 kg   Obstetrical Problem List: JMayra ReelAnzora Problems (from 04/04/21 to present)     Problem Noted Resolved   Rubella non-immune status, antepartum 04/10/2021 by RAdalberto Cole RN No   Overview Signed 04/10/2021 12:00 PM by RAdalberto Cole RN    Offer MMR vaccine post partum       Supervision of high-risk pregnancy, unspecified trimester 04/06/2021 by SLora Havens PA-C No   Overview Addendum 09/25/2021  8:16 AM by BJenetta Downer RN     Nursing Staff Provider  Office Location  [facility] Dating  LMP  Language   Spanish Anatomy [place] WNL, plac ant, 3vc  Flu Vaccine   Given 04/06/21 Genetic Screen  Quad: NEG (05/10/21)   TDaP vaccine   Given; 07/27/21 Hgb A1C or  GTT Early A1C - 5.3; 1 hr GTT - 107 Third trimester 1 hr gtt=90  COVID vaccine Given:  05/25/20 & 06/15/20    Rhogam     LAB RESULTS   Feeding Plan  Breast & Bottle Blood Type   O Positive  (04/06/21)  Contraception  Nexplanon  Antibody   Negative (04/06/21)  Circumcision  Rubella   Non-Immune (04/06/21)  [facility]   Non-reactive (04/06/21), (07/27/2021)  Support Person   HBsAg   Negative   (04/06/21)  Prenatal Classes  HIV   Non-reactive  (04/06/21), (07/27/2021)  _0 - Doula referral?  Varicella   Immune  (04/06/21)    HCV   Negative  (04/06/21)  BTL Consent  GBS  Negative          (09/21/2021)       VBAC Consent  Pap  04/06/21=wnl    Hgb Electro    Negative  BP Cuff ordered  CF   Delivery Group  [facility]   Centering Group  OBCM involved            Obesity affecting pregnancy, antepartum 04/06/2021 by SLora Havens PA-C No   Overview Addendum 06/29/2021  5:07 PM by SHerbie Saxon CNM    Recommendations [x] Aspirin 81 mg daily after 12 weeks; declines to take; discontinue after 36 weeks (pt agrees) [x] Nutrition consult - referral done 04/06/21, counseling done 04/12/21 [ ] Weight gain 11-20 lbs for singleton and 25-35 lbs for twin pregnancy (IOM guidelines) - counseled 04/06/21 Higher class of obesity patients recommended to gain closer to lower limit  Weight loss is associated with adverse outcomes [ ] Baseline and surveillance labs (pulled in from EPIC, refresh links as needed)  No results found for: PLT, CREATININE, AST, ALT, PROTCRRATIO  Antenatal Testing: Not indicated.  [ ] Growth scans every 4-6 weeks as needed (fundal height likely inadequate in morbidly obese patients)  Postpartum Care: [ ] Consider prophylactic wound vac/PICO for C/S [ ] Lovenox for DVT/PE prophylaxis (6 hours after vaginal delivery, 12 hours after C/S).   Lovenox 40 mg [place] q24h (BMI 30.0-39.9 kg/m2)  Lovenox 0.5 mg/kg [place] q12h ((BMI ?40 kg/m2 ); Max 150 mg [place] q12h.  Consider prolonged therapy x 6 weeks PP in very concerning patients (I.e morbid obesity with other co-morbidities that increase risk of DVT/PE) [ ] Counsel about diet, exercise and weight loss. Referrals PRN.  ICD10 Codes: O99.210   Obesity in pregnancy (BMI 30.0-39.9 kg/m2)  O99.210, E66.01 Maternal Morbid Obesity (BMI ?40 kg/m2 ).**Have to use both codes, this is a [employer] code and risk adjusts/more reimbursement**  Obesity is defined as body mass index (BMI) ?30 kg/m2 .  Class I (BMI 30.0 to 34.9 kg/m2) Class II (BMI 35.0 to 39.9 kg/m2) Class III/Morbid obesity (BMI ?40 kg/            Maternal Medical History:   Past Medical History:  Diagnosis Date   Patient denies medical problems     Past Surgical History:  Procedure Laterality Date   denies      No  Known Allergies  Prior to Admission medications   Medication Sig Start Date End Date Taking? Authorizing Provider  Prenatal Vit-Fe Fumarate-FA (PRENATAL MULTIVITAMIN) TABS tablet Take 1 tablet by mouth daily at 12 noon. 10/05/21  Yes Junious Dresser, FNP    OB History  Gravida Para Term Preterm AB Living  1 0 0 0 0 0  SAB IAB Ectopic Multiple Live Births  0 0 0 0 0    # Outcome Date GA Lbr Len/2nd Weight Sex Delivery Anes PTL Lv  1 Current             Obstetric Comments  States nausea and vomiting daily (during telephone questionnaire)    Prenatal care site:  [facility]  Social History: She  reports that she has never smoked. She has never used smokeless tobacco. She reports that she does not drink alcohol and does not use drugs.  Family History: family history includes Diabetes in her maternal grandmother and mother; Heart disease in her brother.    Review of Systems:  Review of Systems  Constitutional:  Negative for chills and fever.  HENT:  Negative for congestion, ear discharge, ear pain, hearing loss, sinus pain and sore throat.   Eyes:  Negative for blurred vision and double vision.  Respiratory:  Negative for cough, shortness of breath and wheezing.   Cardiovascular:  Negative for chest pain, palpitations and leg swelling.  Gastrointestinal:  Negative for abdominal pain, blood in stool, constipation, diarrhea, heartburn, melena, nausea and vomiting.  Genitourinary:  Negative for dysuria, flank pain, frequency, hematuria and urgency.  Musculoskeletal:  Negative for back pain, joint pain and myalgias.  Skin:  Negative for itching and rash.  Neurological:  Negative for dizziness, tingling, tremors, sensory change, speech change, focal weakness, seizures, loss of consciousness, weakness and headaches.  Endo/Heme/Allergies:  Negative for environmental allergies. Does not bruise/bleed easily.  Psychiatric/Behavioral:  Negative for depression, hallucinations, memory loss, substance  abuse and suicidal ideas. The patient is not nervous/anxious and does not have insomnia.     Physical Exam:  BP (!) 147/82 (BP Location: Right Arm)   Pulse 66   Temp 98 F (36.7 C) (Oral)   Resp 18   Ht 5' 6" (1.676 m)   Wt 90.7 kg   LMP 01/17/2021   BMI 32.27 kg/m   Constitutional: Well nourished, well developed female in no acute distress.  HEENT: normal Skin: Warm and dry.  Cardiovascular: Regular rate and rhythm.   Extremity:  trace edema   Respiratory: Clear to auscultation bilateral. Normal respiratory effort Abdomen: FHT present Back: no CVAT Neuro: DTRs 2+, Cranial nerves grossly intact Psych:  Alert and Oriented x3. No memory deficits. Normal mood and affect.  MS: normal gait, normal bilateral lower extremity ROM/strength/stability.  Pelvic exam: per RN Angela Adam 1/50/-3 First dose cytotec placed 0641 AM   Baseline FHR: 135 beats/min   Variability: moderate   Accelerations: present   Decelerations: absent Contractions: present frequency: irregular Overall assessment: reassuring   Lab Results  Component Value Date   SARSCOV2NAA NEGATIVE 10/07/2021    Assessment:  Hannah Savage is a 25 y.o. G1P0000 female at 34w4dwith IOL for Oligohydramnios.   Plan:  Admit to Labor & Delivery  CBC, T&S, Clrs, IVF GBS negative.   Fetal well-being: category I IOL begin with cytotec   JRod Can CNM 10/09/2021 10:04 AM

## 2021-10-09 NOTE — Anesthesia Procedure Notes (Signed)
Epidural Patient location during procedure: OB Start time: 10/09/2021 1:02 PM End time: 10/09/2021 1:06 PM  Staffing Anesthesiologist: Yevette Edwards, MD Resident/CRNA: Hezzie Bump, CRNA Performed: resident/CRNA   Preanesthetic Checklist Completed: patient identified, IV checked, site marked, risks and benefits discussed, surgical consent, monitors and equipment checked, pre-op evaluation and timeout performed  Epidural Patient position: sitting Prep: ChloraPrep Patient monitoring: heart rate, continuous pulse ox and blood pressure Approach: midline Location: L3-L4 Injection technique: LOR saline  Needle:  Needle type: Tuohy  Needle gauge: 17 G Needle length: 9 cm and 9 Needle insertion depth: 6 cm Catheter type: closed end flexible Catheter size: 19 Gauge Catheter at skin depth: 12 cm Test dose: negative and 1.5% lidocaine with Epi 1:200 K  Assessment Sensory level: T10 Events: blood not aspirated, injection not painful, no injection resistance, no paresthesia and negative IV test  Additional Notes 1 attempt Pt. Evaluated and documentation done after procedure finished. Patient identified. Risks/Benefits/Options discussed with patient including but not limited to bleeding, infection, nerve damage, paralysis, failed block, incomplete pain control, headache, blood pressure changes, nausea, vomiting, reactions to medication both or allergic, itching and postpartum back pain. Confirmed with bedside nurse the patient's most recent platelet count. Confirmed with patient that they are not currently taking any anticoagulation, have any bleeding history or any family history of bleeding disorders. Patient expressed understanding and wished to proceed. All questions were answered. Sterile technique was used throughout the entire procedure. Please see nursing notes for vital signs. Test dose was given through epidural catheter and negative prior to continuing to dose epidural or  start infusion. Warning signs of high block given to the patient including shortness of breath, tingling/numbness in hands, complete motor block, or any concerning symptoms with instructions to call for help. Patient was given instructions on fall risk and not to get out of bed. All questions and concerns addressed with instructions to call with any issues or inadequate analgesia.    Patient tolerated the insertion well without immediate complications.Reason for block:procedure for pain

## 2021-10-10 ENCOUNTER — Encounter: Payer: Self-pay | Admitting: Obstetrics and Gynecology

## 2021-10-10 ENCOUNTER — Ambulatory Visit: Payer: Medicaid Other

## 2021-10-10 DIAGNOSIS — O4103X0 Oligohydramnios, third trimester, not applicable or unspecified: Principal | ICD-10-CM

## 2021-10-10 DIAGNOSIS — Z3A38 38 weeks gestation of pregnancy: Secondary | ICD-10-CM

## 2021-10-10 DIAGNOSIS — O09893 Supervision of other high risk pregnancies, third trimester: Secondary | ICD-10-CM

## 2021-10-10 DIAGNOSIS — O99344 Other mental disorders complicating childbirth: Secondary | ICD-10-CM

## 2021-10-10 DIAGNOSIS — F32A Depression, unspecified: Secondary | ICD-10-CM

## 2021-10-10 DIAGNOSIS — Z2839 Other underimmunization status: Secondary | ICD-10-CM

## 2021-10-10 DIAGNOSIS — O411230 Chorioamnionitis, third trimester, not applicable or unspecified: Secondary | ICD-10-CM | POA: Diagnosis not present

## 2021-10-10 DIAGNOSIS — O99214 Obesity complicating childbirth: Secondary | ICD-10-CM

## 2021-10-10 DIAGNOSIS — O1403 Mild to moderate pre-eclampsia, third trimester: Secondary | ICD-10-CM | POA: Diagnosis present

## 2021-10-10 LAB — CBC
HCT: 25 % — ABNORMAL LOW (ref 36.0–46.0)
HCT: 25.3 % — ABNORMAL LOW (ref 36.0–46.0)
Hemoglobin: 9 g/dL — ABNORMAL LOW (ref 12.0–15.0)
Hemoglobin: 8.8 g/dL — ABNORMAL LOW (ref 12.0–15.0)
MCH: 34 pg (ref 26.0–34.0)
MCH: 33 pg (ref 26.0–34.0)
MCHC: 36 g/dL (ref 30.0–36.0)
MCHC: 34.8 g/dL (ref 30.0–36.0)
MCV: 94.3 fL (ref 80.0–100.0)
MCV: 94.8 fL (ref 80.0–100.0)
Platelets: 134 10*3/uL — ABNORMAL LOW (ref 150–400)
Platelets: 103 10*3/uL — ABNORMAL LOW (ref 150–400)
RBC: 2.65 MIL/uL — ABNORMAL LOW (ref 3.87–5.11)
RBC: 2.67 MIL/uL — ABNORMAL LOW (ref 3.87–5.11)
RDW: 13.2 % (ref 11.5–15.5)
RDW: 13.3 % (ref 11.5–15.5)
WBC: 25.3 10*3/uL — ABNORMAL HIGH (ref 4.0–10.5)
WBC: 16.4 10*3/uL — ABNORMAL HIGH (ref 4.0–10.5)
nRBC: 0 % (ref 0.0–0.2)
nRBC: 0 % (ref 0.0–0.2)

## 2021-10-10 LAB — CBC WITH DIFFERENTIAL/PLATELET
Abs Immature Granulocytes: 0.09 10*3/uL — ABNORMAL HIGH (ref 0.00–0.07)
Basophils Absolute: 0 10*3/uL (ref 0.0–0.1)
Basophils Relative: 0 %
Eosinophils Absolute: 0 10*3/uL (ref 0.0–0.5)
Eosinophils Relative: 0 %
HCT: 22.2 % — ABNORMAL LOW (ref 36.0–46.0)
Hemoglobin: 7.8 g/dL — ABNORMAL LOW (ref 12.0–15.0)
Immature Granulocytes: 1 %
Lymphocytes Relative: 8 %
Lymphs Abs: 1.6 10*3/uL (ref 0.7–4.0)
MCH: 33.6 pg (ref 26.0–34.0)
MCHC: 35.1 g/dL (ref 30.0–36.0)
MCV: 95.7 fL (ref 80.0–100.0)
Monocytes Absolute: 0.9 10*3/uL (ref 0.1–1.0)
Monocytes Relative: 5 %
Neutro Abs: 16.7 10*3/uL — ABNORMAL HIGH (ref 1.7–7.7)
Neutrophils Relative %: 86 %
Platelets: 118 10*3/uL — ABNORMAL LOW (ref 150–400)
RBC: 2.32 MIL/uL — ABNORMAL LOW (ref 3.87–5.11)
RDW: 13.4 % (ref 11.5–15.5)
Smear Review: NORMAL
WBC: 19.3 10*3/uL — ABNORMAL HIGH (ref 4.0–10.5)
nRBC: 0 % (ref 0.0–0.2)

## 2021-10-10 LAB — COMPREHENSIVE METABOLIC PANEL
ALT: 14 U/L (ref 0–44)
AST: 25 U/L (ref 15–41)
Albumin: 2.1 g/dL — ABNORMAL LOW (ref 3.5–5.0)
Alkaline Phosphatase: 68 U/L (ref 38–126)
Anion gap: 4 — ABNORMAL LOW (ref 5–15)
BUN: 10 mg/dL (ref 6–20)
CO2: 24 mmol/L (ref 22–32)
Calcium: 7.7 mg/dL — ABNORMAL LOW (ref 8.9–10.3)
Chloride: 110 mmol/L (ref 98–111)
Creatinine, Ser: 0.75 mg/dL (ref 0.44–1.00)
GFR, Estimated: >60 mL/min (ref >60)
Glucose, Bld: 121 mg/dL — ABNORMAL HIGH (ref 70–99)
Potassium: 4.1 mmol/L (ref 3.5–5.1)
Sodium: 138 mmol/L (ref 135–145)
Total Bilirubin: 0.6 mg/dL (ref 0.3–1.2)
Total Protein: 4.7 g/dL — ABNORMAL LOW (ref 6.5–8.1)

## 2021-10-10 LAB — PROTIME-INR
INR: 1.1 (ref 0.8–1.2)
INR: 1 (ref 0.8–1.2)
Prothrombin Time: 14.1 seconds (ref 11.4–15.2)
Prothrombin Time: 13.6 seconds (ref 11.4–15.2)

## 2021-10-10 LAB — FIBRINOGEN
Fibrinogen: 397 mg/dL (ref 210–475)
Fibrinogen: 602 mg/dL — ABNORMAL HIGH (ref 210–475)

## 2021-10-10 MED ORDER — ACETAMINOPHEN 500 MG PO TABS
ORAL_TABLET | ORAL | Status: AC
  Filled 2021-10-10: qty 2

## 2021-10-10 MED ORDER — SODIUM CHLORIDE 0.9% FLUSH: 3.0000 mL | INTRAVENOUS | Status: DC | PRN

## 2021-10-10 MED ORDER — IBUPROFEN 600 MG PO TABS
600.0000 mg | ORAL_TABLET | Freq: Four times a day (QID) | ORAL | Status: DC
  Administered 2021-10-11 – 2021-10-12 (×5): 600 mg via ORAL
  Filled 2021-10-10 (×5): qty 1

## 2021-10-10 MED ORDER — DIPHENHYDRAMINE HCL 25 MG PO CAPS: 25.0000 mg | ORAL_CAPSULE | Freq: Four times a day (QID) | ORAL | Status: DC | PRN

## 2021-10-10 MED ORDER — MEPERIDINE HCL 25 MG/ML IJ SOLN: 6.2500 mg | INTRAMUSCULAR | Status: DC | PRN

## 2021-10-10 MED ORDER — SODIUM CHLORIDE 0.9 % IV SOLN
2.0000 g | INTRAVENOUS | Status: AC
  Administered 2021-10-10: 2 g via INTRAVENOUS
  Filled 2021-10-10: qty 2

## 2021-10-10 MED ORDER — COCONUT OIL OIL: 1.0000 "application " | TOPICAL_OIL | Status: DC | PRN

## 2021-10-10 MED ORDER — SODIUM CHLORIDE 0.9% IV SOLUTION: Freq: Once | INTRAVENOUS | Status: AC

## 2021-10-10 MED ORDER — LACTATED RINGERS IV SOLN: INTRAVENOUS | Status: DC

## 2021-10-10 MED ORDER — SIMETHICONE 80 MG PO CHEW: 80.0000 mg | CHEWABLE_TABLET | ORAL | Status: DC | PRN

## 2021-10-10 MED ORDER — KETOROLAC TROMETHAMINE 30 MG/ML IJ SOLN
30.0000 mg | Freq: Four times a day (QID) | INTRAMUSCULAR | Status: DC | PRN
  Administered 2021-10-10 (×2): 30 mg via INTRAVENOUS
  Filled 2021-10-10 (×3): qty 1

## 2021-10-10 MED ORDER — NALBUPHINE HCL 10 MG/ML IJ SOLN: 5.0000 mg | Freq: Once | INTRAMUSCULAR | Status: DC | PRN

## 2021-10-10 MED ORDER — NALBUPHINE HCL 10 MG/ML IJ SOLN
5.0000 mg | INTRAMUSCULAR | Status: DC | PRN
Start: 2021-10-10 — End: 2021-10-11

## 2021-10-10 MED ORDER — HYDROMORPHONE HCL 1 MG/ML IJ SOLN: 0.2000 mg | INTRAMUSCULAR | Status: DC | PRN

## 2021-10-10 MED ORDER — DIPHENHYDRAMINE HCL 25 MG PO CAPS
25.0000 mg | ORAL_CAPSULE | ORAL | Status: DC | PRN
Start: 2021-10-10 — End: 2021-10-11

## 2021-10-10 MED ORDER — FLEET ENEMA 7-19 GM/118ML RE ENEM: 1.0000 | ENEMA | Freq: Every day | RECTAL | Status: DC | PRN

## 2021-10-10 MED ORDER — ACETAMINOPHEN 500 MG PO TABS
1000.0000 mg | ORAL_TABLET | Freq: Four times a day (QID) | ORAL | Status: AC | PRN
  Administered 2021-10-11 (×2): 1000 mg via ORAL
  Filled 2021-10-10 (×2): qty 2

## 2021-10-10 MED ORDER — DIPHENHYDRAMINE HCL 50 MG/ML IJ SOLN
12.5000 mg | INTRAMUSCULAR | Status: DC | PRN
Start: 2021-10-10 — End: 2021-10-11

## 2021-10-10 MED ORDER — LACTATED RINGERS IV BOLUS
250.0000 mL | Freq: Once | INTRAVENOUS | Status: AC
  Administered 2021-10-10 (×2): 250 mL via INTRAVENOUS

## 2021-10-10 MED ORDER — BUPIVACAINE ON-Q PAIN PUMP (FOR ORDER SET NO CHG)
INJECTION | Status: DC
  Filled 2021-10-10 (×2): qty 1

## 2021-10-10 MED ORDER — OXYTOCIN-SODIUM CHLORIDE 30-0.9 UT/500ML-% IV SOLN
INTRAVENOUS | Status: AC
  Filled 2021-10-10: qty 500

## 2021-10-10 MED ORDER — ONDANSETRON HCL 4 MG/2ML IJ SOLN: 4.0000 mg | Freq: Three times a day (TID) | INTRAMUSCULAR | Status: DC | PRN

## 2021-10-10 MED ORDER — KETOROLAC TROMETHAMINE 30 MG/ML IJ SOLN: 30.0000 mg | Freq: Four times a day (QID) | INTRAMUSCULAR | Status: DC | PRN

## 2021-10-10 MED ORDER — SCOPOLAMINE 1 MG/3DAYS TD PT72
1.0000 | MEDICATED_PATCH | Freq: Once | TRANSDERMAL | Status: DC
  Administered 2021-10-10: 1.5 mg via TRANSDERMAL
  Filled 2021-10-10: qty 1

## 2021-10-10 MED ORDER — ZOLPIDEM TARTRATE 5 MG PO TABS: 5.0000 mg | ORAL_TABLET | Freq: Every evening | ORAL | Status: DC | PRN

## 2021-10-10 MED ORDER — PRENATAL MULTIVITAMIN CH
1.0000 | ORAL_TABLET | Freq: Every day | ORAL | Status: DC
  Administered 2021-10-10 – 2021-10-11 (×2): 1 via ORAL
  Filled 2021-10-10 (×2): qty 1

## 2021-10-10 MED ORDER — BUPIVACAINE 0.25 % ON-Q PUMP DUAL CATH 400 ML
400.0000 mL | INJECTION | Status: DC
  Filled 2021-10-10: qty 400

## 2021-10-10 MED ORDER — NALBUPHINE HCL 10 MG/ML IJ SOLN: 5.0000 mg | INTRAMUSCULAR | Status: DC | PRN

## 2021-10-10 MED ORDER — LACTATED RINGERS IV BOLUS
1000.0000 mL | Freq: Once | INTRAVENOUS | Status: AC
  Administered 2021-10-10: 1000 mL via INTRAVENOUS

## 2021-10-10 MED ORDER — SENNOSIDES-DOCUSATE SODIUM 8.6-50 MG PO TABS
2.0000 | ORAL_TABLET | ORAL | Status: DC
  Administered 2021-10-10 – 2021-10-11 (×2): 2 via ORAL
  Filled 2021-10-10 (×2): qty 2

## 2021-10-10 MED ORDER — OXYTOCIN-SODIUM CHLORIDE 30-0.9 UT/500ML-% IV SOLN
2.5000 [IU]/h | INTRAVENOUS | Status: AC
  Administered 2021-10-10: 2.5 [IU]/h via INTRAVENOUS
  Filled 2021-10-10: qty 500

## 2021-10-10 MED ORDER — CLINDAMYCIN PHOSPHATE 900 MG/50ML IV SOLN
900.0000 mg | Freq: Three times a day (TID) | INTRAVENOUS | Status: AC
  Administered 2021-10-10 (×3): 900 mg via INTRAVENOUS
  Filled 2021-10-10 (×3): qty 50

## 2021-10-10 MED ORDER — WITCH HAZEL-GLYCERIN EX PADS: 1.0000 "application " | MEDICATED_PAD | CUTANEOUS | Status: DC | PRN

## 2021-10-10 MED ORDER — NALOXONE HCL 0.4 MG/ML IJ SOLN: 0.4000 mg | INTRAMUSCULAR | Status: DC | PRN

## 2021-10-10 MED ORDER — FERROUS SULFATE 325 (65 FE) MG PO TABS
325.0000 mg | ORAL_TABLET | Freq: Two times a day (BID) | ORAL | Status: DC
  Administered 2021-10-10 – 2021-10-12 (×5): 325 mg via ORAL
  Filled 2021-10-10 (×5): qty 1

## 2021-10-10 MED ORDER — OXYCODONE HCL 5 MG PO TABS
5.0000 mg | ORAL_TABLET | ORAL | Status: DC | PRN
  Administered 2021-10-10 (×3): 5 mg via ORAL
  Administered 2021-10-11 (×2): 10 mg via ORAL
  Filled 2021-10-10 (×3): qty 2
  Filled 2021-10-10: qty 1
  Filled 2021-10-10: qty 2
  Filled 2021-10-10: qty 1

## 2021-10-10 MED ORDER — MENTHOL 3 MG MT LOZG
1.0000 | LOZENGE | OROMUCOSAL | Status: DC | PRN
  Filled 2021-10-10: qty 9

## 2021-10-10 MED ORDER — BISACODYL 10 MG RE SUPP: 10.0000 mg | Freq: Every day | RECTAL | Status: DC | PRN

## 2021-10-10 MED ORDER — ACETAMINOPHEN 500 MG PO TABS
1000.0000 mg | ORAL_TABLET | Freq: Four times a day (QID) | ORAL | Status: AC
  Administered 2021-10-10 (×4): 1000 mg via ORAL
  Filled 2021-10-10 (×3): qty 2

## 2021-10-10 MED ORDER — DIBUCAINE (PERIANAL) 1 % EX OINT: 1.0000 "application " | TOPICAL_OINTMENT | CUTANEOUS | Status: DC | PRN

## 2021-10-10 MED ORDER — NALOXONE HCL 4 MG/10ML IJ SOLN
1.0000 ug/kg/h | INTRAVENOUS | Status: DC | PRN
  Filled 2021-10-10: qty 5

## 2021-10-10 MED ORDER — SIMETHICONE 80 MG PO CHEW
80.0000 mg | CHEWABLE_TABLET | Freq: Three times a day (TID) | ORAL | Status: DC
  Administered 2021-10-10 – 2021-10-12 (×7): 80 mg via ORAL
  Filled 2021-10-10 (×7): qty 1

## 2021-10-10 NOTE — Progress Notes (Signed)
Pt hypotensive. Pulse WNL, bleeding WNL. Dr Jerene Pitch aware. Fliud bolus given.

## 2021-10-10 NOTE — Progress Notes (Addendum)
Obstetric and Gynecology  Subjective  Tolerating po, no N/V, more abdominal pain than this afternoon.  Passing flatus.  No fevers, chills.  Lochia remains stable  Objective  Vital signs in last 24 hours: Temp:  [97.6 F (36.4 C)-99.2 F (37.3 C)] 99.2 F (37.3 C) (10/18 1940) Pulse Rate:  [67-120] 98 (10/18 1940) Resp:  [12-35] 18 (10/18 1940) BP: (74-127)/(35-83) 124/68 (10/18 1940) SpO2:  [94 %-99 %] 97 % (10/18 1940)     Intake/Output Summary (Last 24 hours) at 10/10/2021 2209 Last data filed at 10/10/2021 2045 Gross per 24 hour  Intake 1637.04 ml  Output 3168 ml  Net -1530.96 ml    General: NAD Pulmonary: no incresaed work of breathing Abdomen: NABS, soft, mildly distended and tympanic, no rebound no guarding, some fundal tenderness with fundus noted at umbilicus.  Lochia remains appropriate.  Exam is stable from this afternoon Incision: D/C/I Extremities:no edema, erythema, or tenderness.  Labs: Results for orders placed or performed during the hospital encounter of 10/09/21 (from the past 24 hour(s))  CBC     Status: Abnormal   Collection Time: 10/10/21  5:52 AM  Result Value Ref Range   WBC 25.3 (H) 4.0 - 10.5 K/uL   RBC 2.65 (L) 3.87 - 5.11 MIL/uL   Hemoglobin 9.0 (L) 12.0 - 15.0 g/dL   HCT 41.9 (L) 62.2 - 29.7 %   MCV 94.3 80.0 - 100.0 fL   MCH 34.0 26.0 - 34.0 pg   MCHC 36.0 30.0 - 36.0 g/dL   RDW 98.9 21.1 - 94.1 %   Platelets 134 (L) 150 - 400 K/uL   nRBC 0.0 0.0 - 0.2 %  Comprehensive metabolic panel     Status: Abnormal   Collection Time: 10/10/21  5:52 AM  Result Value Ref Range   Sodium 138 135 - 145 mmol/L   Potassium 4.1 3.5 - 5.1 mmol/L   Chloride 110 98 - 111 mmol/L   CO2 24 22 - 32 mmol/L   Glucose, Bld 121 (H) 70 - 99 mg/dL   BUN 10 6 - 20 mg/dL   Creatinine, Ser 7.40 0.44 - 1.00 mg/dL   Calcium 7.7 (L) 8.9 - 10.3 mg/dL   Total Protein 4.7 (L) 6.5 - 8.1 g/dL   Albumin 2.1 (L) 3.5 - 5.0 g/dL   AST 25 15 - 41 U/L   ALT 14 0 - 44 U/L    Alkaline Phosphatase 68 38 - 126 U/L   Total Bilirubin 0.6 0.3 - 1.2 mg/dL   GFR, Estimated >81 >44 mL/min   Anion gap 4 (L) 5 - 15  CBC with Differential/Platelet     Status: Abnormal   Collection Time: 10/10/21 12:12 PM  Result Value Ref Range   WBC 19.3 (H) 4.0 - 10.5 K/uL   RBC 2.32 (L) 3.87 - 5.11 MIL/uL   Hemoglobin 7.8 (L) 12.0 - 15.0 g/dL   HCT 81.8 (L) 56.3 - 14.9 %   MCV 95.7 80.0 - 100.0 fL   MCH 33.6 26.0 - 34.0 pg   MCHC 35.1 30.0 - 36.0 g/dL   RDW 70.2 63.7 - 85.8 %   Platelets 118 (L) 150 - 400 K/uL   nRBC 0.0 0.0 - 0.2 %   Neutrophils Relative % 86 %   Neutro Abs 16.7 (H) 1.7 - 7.7 K/uL   Lymphocytes Relative 8 %   Lymphs Abs 1.6 0.7 - 4.0 K/uL   Monocytes Relative 5 %   Monocytes Absolute 0.9 0.1 - 1.0 K/uL  Eosinophils Relative 0 %   Eosinophils Absolute 0.0 0.0 - 0.5 K/uL   Basophils Relative 0 %   Basophils Absolute 0.0 0.0 - 0.1 K/uL   WBC Morphology MORPHOLOGY UNREMARKABLE    RBC Morphology MORPHOLOGY UNREMARKABLE    Smear Review Normal platelet morphology    Immature Granulocytes 1 %   Abs Immature Granulocytes 0.09 (H) 0.00 - 0.07 K/uL  Prepare RBC (crossmatch)     Status: None   Collection Time: 10/10/21  1:25 PM  Result Value Ref Range   Order Confirmation      ORDER PROCESSED BY BLOOD BANK Performed at [facility], [address] [place], [place] [address]   Protime-INR     Status: None   Collection Time: 10/10/21  1:50 PM  Result Value Ref Range   Prothrombin Time 14.1 11.4 - 15.2 seconds   INR 1.1 0.8 - 1.2  Fibrinogen     Status: None   Collection Time: 10/10/21  1:50 PM  Result Value Ref Range   Fibrinogen 397 210 - 475 mg/dL  CBC     Status: Abnormal   Collection Time: 10/10/21  9:34 PM  Result Value Ref Range   WBC 16.4 (H) 4.0 - 10.5 K/uL   RBC 2.67 (L) 3.87 - 5.11 MIL/uL   Hemoglobin 8.8 (L) 12.0 - 15.0 g/dL   HCT 00.9 (L) 23.3 - 00.7 %   MCV 94.8 80.0 - 100.0 fL   MCH 33.0 26.0 - 34.0 pg   MCHC 34.8 30.0 - 36.0  g/dL   RDW 62.2 63.3 - 35.4 %   Platelets 103 (L) 150 - 400 K/uL   nRBC 0.0 0.0 - 0.2 %    Cultures: Results for orders placed or performed during the hospital encounter of 10/07/21  Wet prep, genital     Status: Abnormal   Collection Time: 10/07/21  9:18 PM  Result Value Ref Range Status   Yeast Wet Prep HPF POC NONE SEEN NONE SEEN Final    Comment: Specimen diluted due to transport tube containing more than 1 ml of saline, interpret results with caution.   Trich, Wet Prep NONE SEEN NONE SEEN Final    Comment: Specimen diluted due to transport tube containing more than 1 ml of saline, interpret results with caution.   Clue Cells Wet Prep HPF POC PRESENT (A) NONE SEEN Final    Comment: Specimen diluted due to transport tube containing more than 1 ml of saline, interpret results with caution.   WBC, Wet Prep HPF POC FEW (A) NONE SEEN Final    Comment: Specimen diluted due to transport tube containing more than 1 ml of saline, interpret results with caution.   Sperm NONE SEEN  Final    Comment: Specimen diluted due to transport tube containing more than 1 ml of saline, interpret results with caution. Performed at [facility], [address], [place], [place] [address]     Imaging:  Assessment   25 y.o. G1P1001 POD1 1LTCS, chorioamnionitis, oligohydramnios IUGR, reeclampsia without severe features, and with acute blood loss anemia  Plan    1) Acute blood loss anemia - status post 2U pRBC.  Vitals stable with improvement in tachycarida.  Abdominal exam remains benign, non-distended, no rebound or guarding but some fundal tenderness now 24-hrs from any long acting spinal narcotics.  Second stage arrest C-section following failed VAVD, with bilateral uterine extensions. - repeat CBC in AM - as previously discussed if abdominal distention develops concerning for hemoperitoneum  would consider ultrasound, if no abdominal distention but CBC fails to remain stable CT A/P to  evaluate for hematoma formation - of note platelets have dropped from 134 to 118, now 103.  Platelet morphology normal no evidence of schistocytes on earlier CBC.  Will add on repeat fibrinogen and INR to verify remaining stable - UOP clear non-concentrated urine throughout day and more than adequate UOP  2) Chorioamnionitis - finishing clindamycin this evening.  Monitor temperature, if febrile consider continuing treatment to cover for possible postpartum endometritis.  WBC has appropriately decreased from 25K this morning to 16k

## 2021-10-10 NOTE — Progress Notes (Signed)
Subjective:  Doing well, no concerns.  Lochia < to = menses.  Has not ambulated.  Tolerating po, no nausea or emesis.  Afebrile since delivery.  Pain well controlled on po analgesics,  Objective:  Vital signs in last 24 hours: Temp:  [98.1 F (36.7 C)-100.1 F (37.8 C)] 98.8 F (37.1 C) (10/18 1131) Pulse Rate:  [66-120] 120 (10/18 1200) Resp:  [12-35] 20 (10/18 1131) BP: (74-143)/(35-90) 116/72 (10/18 1131) SpO2:  [86 %-100 %] 97 % (10/18 1200)    Intake/Output      10/17 0701 10/18 0700 10/18 0701 10/19 0700   I.V. (mL/kg) 2882.8 (31.8)    Other 0    Total Intake(mL/kg) 2882.8 (31.8)    Urine (mL/kg/hr) 900 (0.4) 200 (0.3)   Emesis/NG output 300    Blood 1668    Total Output 2868 200   Net +14.8 -200          General: NAD Pulmonary: no increased work of breathing Abdomen: mildly distended, tympanic, non-tender, fundus firm at level of umbilicus, no rebound or guarding Incision: D/C/I Extremities: no edema, no erythema, no tenderness  Results for orders placed or performed during the hospital encounter of 10/09/21 (from the past 72 hour(s))  Type and screen [place]     Status: None (Preliminary result)   Collection Time: 10/09/21  6:13 AM  Result Value Ref Range   ABO/RH(D) O POS    Antibody Screen NEG    Sample Expiration      10/12/2021,2359 Performed at [facility], [address] [place], [place] [address]    Unit Number [id number]    Blood Component Type RED CELLS,LR    Unit division 00    Status of Unit ALLOCATED    Transfusion Status OK TO TRANSFUSE    Crossmatch Result Compatible    Unit Number [id number]    Blood Component Type RBC LR PHER2    Unit division 00    Status of Unit ALLOCATED    Transfusion Status OK TO TRANSFUSE    Crossmatch Result Compatible    Unit Number [id number]    Blood Component Type RED CELLS,LR    Unit division 00    Status of Unit ALLOCATED    Transfusion Status OK TO  TRANSFUSE    Crossmatch Result Compatible    Unit Number [id number]    Blood Component Type RBC LR PHER1    Unit division 00    Status of Unit ALLOCATED    Transfusion Status OK TO TRANSFUSE    Crossmatch Result Compatible   CBC     Status: Abnormal   Collection Time: 10/09/21  6:14 AM  Result Value Ref Range   WBC 11.4 (H) 4.0 - 10.5 K/uL   RBC 3.92 3.87 - 5.11 MIL/uL   Hemoglobin 13.0 12.0 - 15.0 g/dL   HCT 36.8 36.0 - 46.0 %   MCV 93.9 80.0 - 100.0 fL   MCH 33.2 26.0 - 34.0 pg   MCHC 35.3 30.0 - 36.0 g/dL   RDW 13.0 11.5 - 15.5 %   Platelets 131 (L) 150 - 400 K/uL   nRBC 0.0 0.0 - 0.2 %    Comment: Performed at [facility], [address] [place],  [address]  RPR     Status: None   Collection Time: 10/09/21  6:14 AM  Result Value Ref Range   RPR Ser Ql NON REACTIVE NON REACTIVE    Comment: Performed at [facility]  Lab, [address] [place], [place] [address]  ABO/Rh     Status: None   Collection Time: 10/09/21  7:08 AM  Result Value Ref Range   ABO/RH(D)      O POS Performed at [facility], [address] [place], [place] [address]   Protein / creatinine ratio, urine     Status: Abnormal   Collection Time: 10/09/21  8:14 PM  Result Value Ref Range   Creatinine, Urine 55 mg/dL   Total Protein, Urine 16 mg/dL    Comment: NO NORMAL RANGE ESTABLISHED FOR THIS TEST   Protein Creatinine Ratio 0.29 (H) 0.00 - 0.15 mg/mg[Cre]    Comment: Performed at [facility], [address] [place], [place] [address]  CBC     Status: Abnormal   Collection Time: 10/10/21  5:52 AM  Result Value Ref Range   WBC 25.3 (H) 4.0 - 10.5 K/uL   RBC 2.65 (L) 3.87 - 5.11 MIL/uL   Hemoglobin 9.0 (L) 12.0 - 15.0 g/dL    Comment: REPEATED TO VERIFY   HCT 25.0 (L) 36.0 - 46.0 %   MCV 94.3 80.0 - 100.0 fL   MCH 34.0 26.0 - 34.0 pg   MCHC 36.0 30.0 - 36.0 g/dL   RDW 13.2 11.5 - 15.5 %   Platelets 134 (L) 150 - 400 K/uL    Comment: Immature  Platelet Fraction may be clinically indicated, consider ordering this additional test [id number]    nRBC 0.0 0.0 - 0.2 %    Comment: Performed at [facility], [address] [place], [place] [address]  Comprehensive metabolic panel     Status: Abnormal   Collection Time: 10/10/21  5:52 AM  Result Value Ref Range   Sodium 138 135 - 145 mmol/L   Potassium 4.1 3.5 - 5.1 mmol/L   Chloride 110 98 - 111 mmol/L   CO2 24 22 - 32 mmol/L   Glucose, Bld 121 (H) 70 - 99 mg/dL    Comment: Glucose reference range applies only to samples taken after fasting for at least 8 hours.   BUN 10 6 - 20 mg/dL   Creatinine, Ser 0.75 0.44 - 1.00 mg/dL   Calcium 7.7 (L) 8.9 - 10.3 mg/dL   Total Protein 4.7 (L) 6.5 - 8.1 g/dL   Albumin 2.1 (L) 3.5 - 5.0 g/dL   AST 25 15 - 41 U/L   ALT 14 0 - 44 U/L   Alkaline Phosphatase 68 38 - 126 U/L   Total Bilirubin 0.6 0.3 - 1.2 mg/dL   GFR, Estimated >60 >60 mL/min    Comment: (NOTE) Calculated using the CKD-EPI Creatinine Equation (2021)    Anion gap 4 (L) 5 - 15    Comment: Performed at [facility], [address] [place],  [address]  CBC with Differential/Platelet     Status: Abnormal   Collection Time: 10/10/21 12:12 PM  Result Value Ref Range   WBC 19.3 (H) 4.0 - 10.5 K/uL   RBC 2.32 (L) 3.87 - 5.11 MIL/uL   Hemoglobin 7.8 (L) 12.0 - 15.0 g/dL   HCT 22.2 (L) 36.0 - 46.0 %   MCV 95.7 80.0 - 100.0 fL   MCH 33.6 26.0 - 34.0 pg   MCHC 35.1 30.0 - 36.0 g/dL   RDW 13.4 11.5 - 15.5 %   Platelets 118 (L) 150 - 400 K/uL    Comment: Immature Platelet Fraction may be clinically indicated, consider ordering this additional test [id number]  nRBC 0.0 0.0 - 0.2 %   Neutrophils Relative % 86 %   Neutro Abs 16.7 (H) 1.7 - 7.7 K/uL   Lymphocytes Relative 8 %   Lymphs Abs 1.6 0.7 - 4.0 K/uL   Monocytes Relative 5 %   Monocytes Absolute 0.9 0.1 - 1.0 K/uL   Eosinophils Relative 0 %   Eosinophils Absolute 0.0 0.0 - 0.5 K/uL    Basophils Relative 0 %   Basophils Absolute 0.0 0.0 - 0.1 K/uL   WBC Morphology MORPHOLOGY UNREMARKABLE    RBC Morphology MORPHOLOGY UNREMARKABLE    Smear Review Normal platelet morphology    Immature Granulocytes 1 %   Abs Immature Granulocytes 0.09 (H) 0.00 - 0.07 K/uL    Comment: Performed at [facility], [address] [place], [place] [address]  Prepare RBC (crossmatch)     Status: None   Collection Time: 10/10/21  1:25 PM  Result Value Ref Range   Order Confirmation      ORDER PROCESSED BY BLOOD BANK Performed at [facility], [address] [place], [place] [address]     Immunization History  Administered Date(s) Administered   Influenza,inj,Quad PF,6+ Mos 04/06/2021   Influenza-Unspecified 09/21/2021   Tdap 07/27/2021    Assessment:   25 y.o. G1P1001 postoperativeday # 1 1LTCS for fetal intolerance to labor, chorioamnionitis, preeclampsia without severe features, oligohydramnios not with drop in H&H on serial CBC   Plan:  ) Acute blood loss anemia - patient with failed attempt at VAVD and second stage arrest cesarean section.  Now with hypotension and tachycardia.  Patient asymptomatic but given Hgb drop of almost 40% discussed blood transfusion, 2 units to transfuse and 2 units keep ahead. - serial abdominal exam if increased distention will consider abdominal ultrasound to evaluate for hemoperitoneum and vascular surgery evaluation.  If no significant abdominal distention but inappropriate rise or further drop in H&H consider CT A/P to evaluate for possible hematoma formation - fibrinogen and INR ordered stat  2) Blood Type --/--/O POS Performed at [facility], [address] [place], [place] [address]  [phone]) / Charlynn Grimes <0.90 (04/14 1607) /  - MMR at discharge  3) TDAP status up to date  4) Feeding plan breast  5) Chorioamnionitis - remains on clindamycin for 24-hr postpartum.  Also received ceftriaxone 2g  6) Disposition  pending stable H&H  Malachy Mood, MD, [facility] 10/10/2021, 1:49 PM

## 2021-10-10 NOTE — Discharge Instructions (Signed)
Discharge Instructions:   Follow-up Appointment: 1 week, please call the office to schedule  If there are any new medications, they have been ordered and will be available for pickup at the listed pharmacy on your way home from the hospital.   Call the office if you have any of the following: headache, visual changes, fever >101.0 F, chills, shortness of breath, breast concerns, excessive vaginal bleeding, incision drainage or problems, leg pain or redness, depression or any other concerns. If you have vaginal discharge with an odor, let your doctor know.   It is normal to bleed for up to 6 weeks. You should not soak through more than 1 pad in 1 hour. If you have a blood clot larger than your fist with continued bleeding, call your doctor.   After a c-section, you should expect a small amount of blood or clear fluid coming from the incision and abdominal cramping/soreness. Inspect your incision site daily. Stand in front of a mirror to look for any redness, incision opening, or discolored/odorness drainage. Take a shower daily and continue good hygiene. Use own towel and washcloth (do not share). Make sure your sheets on your bed are clean. No pets sleeping around your incision site. Dressing will be removed at your postpartum visit. If the dressing does become wet or soiled underneath, it is okay to remove it before your visit.    On-Q pump: You will remove on day 4 after insertion or if the ball becomes flat before day 4. You will remove on: 10/14/2021  Activity: Do not lift > 15 lbs for 6 weeks (do not lift anything heavier than your baby). No intercourse, tampons, swimming pools, hot tubs, baths (only showers) for 6 weeks.  No driving for 1-2 weeks. Do not drive while taking narcotic or opioid pain medication.  Continue taking your prenatal vitamin, especially if breastfeeding. Increase calories and fluids (water) while breastfeeding.   Your milk will come in, in the next couple of days  (right now it is colostrum). You may have a slight fever when your milk comes in, but it should go away on its own.  If it does not, and rises above 101 F please call the doctor. You will also feel achy and your breasts will be firm. They will also start to leak. If you are breastfeeding, continue as you have been and you can pump/express milk for comfort.   If you have too much milk, your breasts can become engorged, which could lead to mastitis. This is an infection of the milk ducts. It can be very painful and you will need to notify your doctor to obtain a prescription for antibiotics. You can also treat it with a shower or hot/cold compress.   For concerns about your baby, please call your pediatrician.  For breastfeeding concerns, the lactation consultant can be reached at [phone].   Postpartum blues (feelings of happy one minute and sad another minute) are normal for the first few weeks but if it gets worse let your doctor know.   Congratulations! We enjoyed caring for you and your new bundle of joy!

## 2021-10-10 NOTE — Progress Notes (Signed)
Spoke with Dr. Laural Benes, anesthesia. Pt is having pain 8/10, request authorization to give PO oxy prior to 12 hr as per orders. Per Dr. Laural Benes, ok to give PO oxy as long as patient is alert and respiratory status is not compromised. Pt is alert, on monitor for HR/O2. Will continue to monitor.

## 2021-10-10 NOTE — Anesthesia Postprocedure Evaluation (Signed)
Anesthesia Post Note  Patient: Hannah Savage  Procedure(s) Performed: CESAREAN SECTION  Patient location during evaluation: Mother Baby Anesthesia Type: Epidural Level of consciousness: oriented and awake and alert Pain management: pain level controlled Vital Signs Assessment: post-procedure vital signs reviewed and stable Respiratory status: spontaneous breathing and respiratory function stable Cardiovascular status: blood pressure returned to baseline and stable Postop Assessment: no headache, no backache, no apparent nausea or vomiting and able to ambulate Anesthetic complications: no   No notable events documented.   Last Vitals:  Vitals:   10/10/21 0700 10/10/21 0800  BP:  99/67  Pulse: 86 94  Resp:  20  Temp:  37.1 C  SpO2: 98% 99%    Last Pain:  Vitals:   10/10/21 0800  TempSrc: Oral  PainSc:                  Jules Schick

## 2021-10-10 NOTE — Addendum Note (Signed)
Addendum  created 10/10/21 0823 by Mathews Argyle, CRNA   Clinical Note Signed

## 2021-10-10 NOTE — Discharge Summary (Signed)
Postpartum Discharge Summary  Date of Service updated 10/12/2021     Patient Name: Hannah Savage DOB: [birth year] MRN: [id number]  Date of admission: 10/09/2021 Delivery date:10/09/2021  Delivering provider: Adrian Prows R  Date of discharge: 10/12/2021  Admitting diagnosis: Oligohydramnios in singleton pregnancy in third trimester [O41.03X0] Intrauterine pregnancy: [redacted]w[redacted]d    Secondary diagnosis:  Active Problems:   Oligohydramnios in singleton pregnancy in third trimester   Fetal intolerance to labor, delivered, current hospitalization   Mild pre-eclampsia in third trimester Chorioamnionitis, Failed VAVD, Fetal intolerance of labor, Preeclampsia with moderate range blood pressures blood loss anemia Additional problems: none    Discharge diagnosis: Term Pregnancy Delivered, Preeclampsia (mild), and Anemia                                              Post partum procedures:blood transfusion Augmentation: Pitocin and Cytotec Complications: Intrauterine Inflammation or infection (Chorioamniotis)  Hospital course: Induction of Labor With Cesarean Section   25y.o. yo G1P1001 at 329w4das admitted to the hospital 10/09/2021 for induction of labor. Patient had a labor course significant for fetal intolerance of pushing. The patient went for cesarean section due to Non-Reassuring FHR after attempted VAVD. Delivery details are as follows: Membrane Rupture Time/Date: 2:47 PM ,10/09/2021   Delivery Method:C-Section, Vacuum Assisted  Details of operation can be found in separate operative Note.  Patient had an uncomplicated postpartum course. She is ambulating, tolerating a regular diet, passing flatus, and urinating well.  Patient is discharged home in stable condition on 10/12/21.      Newborn Data: Birth date:10/09/2021  Birth time:10:36 PM  Gender:Female  Living status:Living  Apgars:2 ,8  Weight:2860 g                                Magnesium Sulfate received:  No BMZ received: No Rhophylac:N/A MMR:Yes prior to discharge T-DaP:Given prenatally Flu: No Transfusion:Yes  Physical exam  Vitals:   10/11/21 1551 10/11/21 1925 10/11/21 2330 10/12/21 0750  BP: 119/83 130/89 116/75 127/83  Pulse: 95 93 (!) 102 80  Resp: '18 18 18 20  ' Temp: 98.4 F (36.9 C) 98.9 F (37.2 C) 98.5 F (36.9 C) 98.7 F (37.1 C)  TempSrc: Oral Oral Oral Oral  SpO2:  100% 98% 98%  Weight:      Height:       General: alert, cooperative, and no distress non English speaking Lochia: appropriate Uterine Fundus: firm Incision: Healing well with no significant drainage, Dressing is clean, dry, and intact DVT Evaluation: No evidence of DVT seen on physical exam. Negative Homan's sign. Labs: Lab Results  Component Value Date   WBC 16.5 (H) 10/11/2021   HGB 8.9 (L) 10/11/2021   HCT 24.6 (L) 10/11/2021   MCV 93.9 10/11/2021   PLT 108 (L) 10/11/2021   CMP Latest Ref Rng & Units 10/11/2021  Glucose 70 - 99 mg/dL 76  BUN 6 - 20 mg/dL 7  Creatinine 0.44 - 1.00 mg/dL 0.60  Sodium 135 - 145 mmol/L 137  Potassium 3.5 - 5.1 mmol/L 3.6  Chloride 98 - 111 mmol/L 106  CO2 22 - 32 mmol/L 23  Calcium 8.9 - 10.3 mg/dL 8.1(L)  Total Protein 6.5 - 8.1 g/dL 5.0(L)  Total Bilirubin 0.3 - 1.2 mg/dL 0.4  Alkaline Phos  38 - 126 U/L 72  AST 15 - 41 U/L 19  ALT 0 - 44 U/L 13   Edinburgh Score: Edinburgh Postnatal Depression Scale Screening Tool 10/10/2021  I have been able to laugh and see the funny side of things. 0  I have looked forward with enjoyment to things. 0  I have blamed myself unnecessarily when things went wrong. 0  I have been anxious or worried for no good reason. 0  I have felt scared or panicky for no good reason. 0  Things have been getting on top of me. 1  I have been so unhappy that I have had difficulty sleeping. 0  I have felt sad or miserable. 0  I have been so unhappy that I have been crying. 0  The thought of harming myself has occurred to me. 0   Edinburgh Postnatal Depression Scale Total 1      After visit meds:  Allergies as of 10/12/2021   No Known Allergies      Medication List     TAKE these medications    ferrous sulfate 325 (65 FE) MG tablet Take 1 tablet (325 mg total) by mouth 2 (two) times daily with a meal.   ibuprofen 600 MG tablet Commonly known as: ADVIL Take 1 tablet (600 mg total) by mouth every 6 (six) hours.   oxyCODONE 5 MG immediate release tablet Commonly known as: Oxy IR/ROXICODONE Take 1-2 tablets (5-10 mg total) by mouth every 4 (four) hours as needed for moderate pain.   prenatal multivitamin Tabs tablet Take 1 tablet by mouth daily at 12 noon.               Discharge Care Instructions  (From admission, onward)           Start     Ordered   10/12/21 0000  Leave dressing on - Keep it clean, dry, and intact until clinic visit        10/12/21 0833             Discharge home in stable condition Infant Feeding: Bottle and Breast Infant Disposition:home with mother Discharge instruction: per After Visit Summary and Postpartum booklet. Activity: Advance as tolerated. Pelvic rest for 6 weeks.  Diet: routine diet Anticipated Birth Control: nexplanon Postpartum Appointment:1 week Additional Postpartum F/U: Incision check 1 week and BP check 1 week Future Appointments:No future appointments. Follow up Visit:  Follow-up Information     Schuman, Stefanie Libel, MD. Schedule an appointment as soon as possible for a visit in 1 week(s).   Specialty: Obstetrics and Gynecology Why: For incision check Contact information: [address] [place] [address] [phone]                Prescription of Oxycodone provided in printed form to the patient. Motrin Rx sent to pharmacy. Translator used in providing all discharge instruction.     10/12/2021 Imagene Riches, CNM

## 2021-10-10 NOTE — Op Note (Signed)
Cesarean Section Procedure Note 10/10/21  Pre-operative Diagnosis:  1. Fetal Intolerance of Labor  2. Failed VAVD Post-operative Diagnosis: same, delivered. Procedure:  Prmary Low Transverse Cesarean Section  Surgeon:  Adelene Idler MD  Assistant(s):  Jennell Corner MD   Tresea Mall CNM Anesthesia: Epidural Estimated Blood Loss: 659 cc Complications: None; patient tolerated the procedure well.  Disposition: PACU - hemodynamically stable. Condition: stable   Findings: A female infant in the cephalic presentation. Amniotic fluid - clear  Birth weight: 6 lbs 9 oz Apgars of 2 and 8.  Intact placenta with a three-vessel cord. Grossly normal uterus, tubes and ovaries bilaterally.  No intraabdominal adhesions were noted.   Procedure Details:  The patient was taken to operating room, identified as the correct patient and the procedure verified as C-Section Delivery. A time out was held and the above information confirmed. After induction of anesthesia, the patient was draped and prepped in the usual sterile manner. A Pfannenstiel incision was made and carried down through the subcutaneous tissue to the fascia. Fascial incision was made and extended transversely with the Mayo scissors. The fascia was separated from the underlying rectus tissue superiorly and inferiorly. The peritoneum was identified and entered bluntly. Peritoneal incision was extended longitudinally. A low transverse hysterotomy was made. An Alexis retractor was placed. The fetus was delivered atraumatically with assistance of a hand from below. Vacuum was attempted but was not successful, so the infant was converted to the breech position and retracted with normal breech delivery maneuvers.  The umbilical cord was clamped x2 and cut and the infant was handed to the awaiting pediatricians. The placenta was removed intact and appeared normal with a 3-vessel cord. The uterus was exteriorized and cleared of all clot and  debris. The hysterotomy was closed with running sutures of 0 Vicryl suture.  A second imbricating layer was placed with the same suture. Fibrillar was applied to the right aspect of the hysterotomy. Excellent hemostasis was observed.  The uterus was returned to the abdomen. The Alexis retractor was removed. The pelvis was inspected and again, excellent hemostasis was noted. The peritoneum was closed with a running stitch of 2-0 Vicryl. The On Q Pain pump system was then placed.  Trocars were placed through the abdominal wall into the subfascial space and these were used to thread the silver soaker cathaters into place.The rectus muscles were inspected and were hemostatic. The rectus fascia was then reapproximated with running sutures of 0-vicryl, with careful placement not to incorporate the cathaters. Subcutaneous tissues are then irrigated with saline and hemostasis assured with the bovie. The subcutaneous fat was approximated with 3-0 plain and a running stitch.  The skin was closed with 4-0 monocryl suture in a subcuticular fashion followed by skin adhesive. The cathaters are flushed each with 5 mL of Bupivicaine and stabilized into place with dressing. Instrument, sponge, and needle counts were correct prior to the abdominal closure and at the conclusion of the case.  The patient tolerated the procedure well and was transferred to the recovery room in stable condition.   Dr. Feliberto Gottron and Tresea Mall assisted with this case. This was a high level case requiring a Financial controller. No other assistant was readily available. They assisted with application of fundal pressure and retraction of tissue during the case.    Natale Milch MD [facility], [facility] 10/10/21 12:20 AM

## 2021-10-10 NOTE — Lactation Note (Signed)
This note was copied from a baby's chart. Lactation Consultation Note  Patient Name: Boy Noele Icenhour [id number] Date: 10/10/2021 Reason for consult: Initial assessment;Primapara;1st time breastfeeding;Early term 37-38.6wks;Other (Comment) (emergency c-section) Age:25 hours  Initial lactation visit. P1 mom delivered via emergency c-section 16 hours ago with medications used that mom was advised to delay breastfeeding for 12 hours. Baby has tolerated bottles well, 16mL feedings thus far.   Lactation at bedside to assist with first attempted breastfeeding. Mom has well everted bilateral nipples, colostrum that is easily expressed through hand expression. Baby awake/alert.  Hand expression used while rubbing nipple from nose to chin. Baby grasps at the breast easily, a few tries to sustain latch and then rhythmic sucking pattern. Baby at breast for 15 minutes, on/off, mirroring first time breastfeeders.   With use of interpreter [phone]) feeding plan is to put baby to breast on demand/minimum every 3 hours, and supplement 7-63mL formula post feedings as needed based on baby's behavior.  Education given on position/latch, alignment of baby at the breast, sandwiching of the tissue in shape of baby's mouth, and hand expression to encourage wide open mouth and deep latch. Tips given for keeping baby alert at the breast, and encouraging a full feeding. Reviewed newborn stomach size, feeding patterns, early cues, and output expectations.  Encouraged to hit the nurses call bell and ask for lactation with any questions/concerns, or call number provided on the whiteboard.  Maternal Data Has patient been taught Hand Expression?: Yes Does the patient have breastfeeding experience prior to this delivery?: No  Feeding Mother's Current Feeding Choice: Breast Milk and Formula  LATCH Score Latch: Repeated attempts needed to sustain latch, nipple held in mouth throughout feeding, stimulation needed to  elicit sucking reflex.  Audible Swallowing: A few with stimulation  Type of Nipple: Everted at rest and after stimulation  Comfort (Breast/Nipple): Soft / non-tender  Hold (Positioning): Assistance needed to correctly position infant at breast and maintain latch.  LATCH Score: 7   Lactation Tools Discussed/Used    Interventions Interventions: Breast feeding basics reviewed;Assisted with latch;Hand express;Breast compression;Adjust position;Support pillows;Position options;Education  Discharge    Consult Status Consult Status: Follow-up Date: 10/10/21 Follow-up type: In-patient    Danford Bad 10/10/2021, 2:49 PM

## 2021-10-10 NOTE — Anesthesia Post-op Follow-up Note (Signed)
  Anesthesia Pain Follow-up Note  Patient: Hannah Savage  Day #: 1  Date of Follow-up: 10/10/2021 Time: 8:23 AM  Last Vitals:  Vitals:   10/10/21 0700 10/10/21 0800  BP:  99/67  Pulse: 86 94  Resp:  20  Temp:  37.1 C  SpO2: 98% 99%    Level of Consciousness: alert  Pain: mild   Side Effects:None  Catheter Site Exam:clean     Plan: D/C from anesthesia care at surgeon's request  Jules Schick

## 2021-10-11 LAB — CBC
HCT: 24.6 % — ABNORMAL LOW (ref 36.0–46.0)
Hemoglobin: 8.9 g/dL — ABNORMAL LOW (ref 12.0–15.0)
MCH: 34 pg (ref 26.0–34.0)
MCHC: 36.2 g/dL — ABNORMAL HIGH (ref 30.0–36.0)
MCV: 93.9 fL (ref 80.0–100.0)
Platelets: 108 10*3/uL — ABNORMAL LOW (ref 150–400)
RBC: 2.62 MIL/uL — ABNORMAL LOW (ref 3.87–5.11)
RDW: 13.9 % (ref 11.5–15.5)
WBC: 16.5 10*3/uL — ABNORMAL HIGH (ref 4.0–10.5)
nRBC: 0 % (ref 0.0–0.2)

## 2021-10-11 LAB — COMPREHENSIVE METABOLIC PANEL WITH GFR
ALT: 13 U/L (ref 0–44)
AST: 19 U/L (ref 15–41)
Albumin: 2.3 g/dL — ABNORMAL LOW (ref 3.5–5.0)
Alkaline Phosphatase: 72 U/L (ref 38–126)
Anion gap: 8 (ref 5–15)
BUN: 7 mg/dL (ref 6–20)
CO2: 23 mmol/L (ref 22–32)
Calcium: 8.1 mg/dL — ABNORMAL LOW (ref 8.9–10.3)
Chloride: 106 mmol/L (ref 98–111)
Creatinine, Ser: 0.6 mg/dL (ref 0.44–1.00)
GFR, Estimated: >60 mL/min
Glucose, Bld: 76 mg/dL (ref 70–99)
Potassium: 3.6 mmol/L (ref 3.5–5.1)
Sodium: 137 mmol/L (ref 135–145)
Total Bilirubin: 0.4 mg/dL (ref 0.3–1.2)
Total Protein: 5 g/dL — ABNORMAL LOW (ref 6.5–8.1)

## 2021-10-11 LAB — PROTIME-INR
INR: 1.1 (ref 0.8–1.2)
Prothrombin Time: 13.8 seconds (ref 11.4–15.2)

## 2021-10-11 LAB — MAGNESIUM: Magnesium: 1.6 mg/dL — ABNORMAL LOW (ref 1.7–2.4)

## 2021-10-11 LAB — FIBRINOGEN: Fibrinogen: 638 mg/dL — ABNORMAL HIGH (ref 210–475)

## 2021-10-11 NOTE — Progress Notes (Signed)
Subjective: Postpartum Day 2: Cesarean Delivery Patient reports tolerating PO and + flatus.  She is voiding but her urine output has not been measured today. Has not been up walking much.  Objective: Vital signs in last 24 hours: Temp:  [97.6 F (36.4 C)-99.2 F (37.3 C)] 98.4 F (36.9 C) (10/19 0837) Pulse Rate:  [86-120] 86 (10/19 0837) Resp:  [16-20] 18 (10/19 0837) BP: (104-131)/(60-86) 131/86 (10/19 0837) SpO2:  [94 %-99 %] 97 % (10/19 0837)  Physical Exam:  General: cooperative, no distress, and mildly obese Lochia: appropriate Uterine Fundus: firm Incision: healing well, no significant drainage, + bowel sounds, no abdominal distension. DVT Evaluation: No evidence of DVT seen on physical exam. Negative Homan's sign.  Recent Labs    10/10/21 2134 10/11/21 0501  HGB 8.8* 8.9*  HCT 25.3* 24.6*    Assessment/Plan: Status post Cesarean sectionDay 2. Failed VAVD--primary Cesasrean section with significant blood loss  Postoperative course complicated by iron deficiency anemia- received two units RBCs yesterday.   Continue current care Will measure urine output today and she is strongly encouraged to walk in the hallway several times today. Will consider discharge tomorrow based on her vitals, lab values and I and O measures.Hannah Savage 10/11/2021, 9:41 AM

## 2021-10-12 ENCOUNTER — Telehealth: Payer: Self-pay

## 2021-10-12 MED ORDER — IBUPROFEN 600 MG PO TABS: 600.0000 mg | ORAL_TABLET | Freq: Four times a day (QID) | ORAL | 0 refills | Status: AC

## 2021-10-12 MED ORDER — MEASLES, MUMPS & RUBELLA VAC IJ SOLR
0.5000 mL | Freq: Once | INTRAMUSCULAR | Status: AC
  Administered 2021-10-12: 0.5 mL via SUBCUTANEOUS
  Filled 2021-10-12 (×2): qty 0.5

## 2021-10-12 MED ORDER — OXYCODONE HCL 5 MG PO TABS: 5.0000 mg | ORAL_TABLET | ORAL | 0 refills | Status: AC | PRN

## 2021-10-12 MED ORDER — FERROUS SULFATE 325 (65 FE) MG PO TABS: 325.0000 mg | ORAL_TABLET | Freq: Two times a day (BID) | ORAL | 1 refills | Status: AC

## 2021-10-12 NOTE — Telephone Encounter (Signed)
[facility] calling for confirmation of directions for oxycodone as it exceeds the limit the way it is written.  [phone]

## 2021-10-12 NOTE — Progress Notes (Signed)
Patient discharged home.  Discharge instructions given to parents via interpreter, Harriett Sine # [phone]. Mother verbalized understanding.  Tag removed, bands matched, escorted by auxiliary.

## 2021-10-13 LAB — BPAM RBC
Blood Product Expiration Date: 202211132359
Blood Product Expiration Date: 202211132359
Blood Product Expiration Date: 202211162359
Blood Product Expiration Date: 202211182359
ISSUE DATE / TIME: 202210181400
ISSUE DATE / TIME: 202210181641
Unit Type and Rh: 5100
Unit Type and Rh: 5100
Unit Type and Rh: 5100
Unit Type and Rh: 5100

## 2021-10-13 LAB — TYPE AND SCREEN
ABO/RH(D): O POS
Antibody Screen: NEGATIVE
Unit division: 0
Unit division: 0
Unit division: 0
Unit division: 0

## 2021-10-13 LAB — PREPARE RBC (CROSSMATCH)

## 2021-10-20 ENCOUNTER — Ambulatory Visit: Payer: Self-pay | Admitting: Obstetrics and Gynecology

## 2021-10-20 ENCOUNTER — Other Ambulatory Visit: Payer: Self-pay

## 2021-10-20 ENCOUNTER — Encounter: Payer: Self-pay | Admitting: Advanced Practice Midwife

## 2021-10-20 ENCOUNTER — Ambulatory Visit (INDEPENDENT_AMBULATORY_CARE_PROVIDER_SITE_OTHER): Payer: Self-pay | Admitting: Advanced Practice Midwife

## 2021-10-20 VITALS — BP 110/80 | Ht 66.0 in | Wt 179.0 lb

## 2021-10-20 DIAGNOSIS — Z09 Encounter for follow-up examination after completed treatment for conditions other than malignant neoplasm: Secondary | ICD-10-CM

## 2021-10-20 NOTE — Progress Notes (Signed)
Patient ID: Hannah Savage, female   DOB: July 27, 1996, 25 y.o.   MRN: [id number]  Reason for Consult: Wound Check (No concerns)   Subjective:  HPI:  Hannah Savage is a 25 y.o. female being seen for 10 day s/p c/section wound check. The patient reports doing well and has no concerns. Her pain is well controlled. Breastfeeding is going well.   Past Medical History:  Diagnosis Date   Mild pre-eclampsia in third trimester    Patient denies medical problems    Family History  Problem Relation Age of Onset   Diabetes Mother    Heart disease Brother    Diabetes Maternal Grandmother    Past Surgical History:  Procedure Laterality Date   CESAREAN SECTION  10/09/2021   Procedure: CESAREAN SECTION;  Surgeon: Natale Milch, MD;  Location: [facility] ORS;  Service: Obstetrics;;   denies      Short Social History:  Social History   Tobacco Use   Smoking status: Never   Smokeless tobacco: Never   Tobacco comments:    Denies secondhand smoke   Substance Use Topics   Alcohol use: Never    No Known Allergies  Current Outpatient Medications  Medication Sig Dispense Refill   ferrous sulfate 325 (65 FE) MG tablet Take 1 tablet (325 mg total) by mouth 2 (two) times daily with a meal. 30 tablet 1   ibuprofen (ADVIL) 600 MG tablet Take 1 tablet (600 mg total) by mouth every 6 (six) hours. 30 tablet 0   oxyCODONE (OXY IR/ROXICODONE) 5 MG immediate release tablet Take 1-2 tablets (5-10 mg total) by mouth every 4 (four) hours as needed for moderate pain. 30 tablet 0   Prenatal Vit-Fe Fumarate-FA (PRENATAL MULTIVITAMIN) TABS tablet Take 1 tablet by mouth daily at 12 noon. 100 tablet 0   No current facility-administered medications for this visit.    Review of Systems  Constitutional:  Negative for chills and fever.  HENT:  Negative for congestion, ear discharge, ear pain, hearing loss, sinus pain and sore throat.   Eyes:  Negative for blurred vision and double vision.   Respiratory:  Negative for cough, shortness of breath and wheezing.   Cardiovascular:  Negative for chest pain, palpitations and leg swelling.  Gastrointestinal:  Negative for abdominal pain, blood in stool, constipation, diarrhea, heartburn, melena, nausea and vomiting.  Genitourinary:  Negative for dysuria, flank pain, frequency, hematuria and urgency.  Musculoskeletal:  Negative for back pain, joint pain and myalgias.  Skin:  Negative for itching and rash.  Neurological:  Negative for dizziness, tingling, tremors, sensory change, speech change, focal weakness, seizures, loss of consciousness, weakness and headaches.  Endo/Heme/Allergies:  Negative for environmental allergies. Does not bruise/bleed easily.  Psychiatric/Behavioral:  Negative for depression, hallucinations, memory loss, substance abuse and suicidal ideas. The patient is not nervous/anxious and does not have insomnia.        Objective:  Objective   Vitals:   10/20/21 0959  BP: 110/80  Weight: 179 lb (81.2 kg)  Height: 5\' 6"  (1.676 m)   Body mass index is 28.89 kg/m. Constitutional: Well nourished, well developed female in no acute distress.  HEENT: normal Skin: Warm and dry.  Cardiovascular: Regular rate and rhythm.   Extremity:  no edema   Respiratory: Clear to auscultation bilateral. Normal respiratory effort Abdomen: dressing removed, incision is approximated, healed, no drainage or redness or signs of infection.  Neuro: DTRs 2+, Cranial nerves grossly intact Psych: Alert and Oriented x3. No memory  deficits. Normal mood and affect.  MS: normal gait, normal bilateral lower extremity ROM/strength/stability.   Assessment/Plan:     25 y.o. G1 P1001 female normal incision check  Schedule 6 week postpartum visit with [facility]     Tresea Mall CNM [facility] 10/20/2021, 1:56 PM

## 2021-11-20 ENCOUNTER — Ambulatory Visit: Payer: Self-pay | Admitting: Obstetrics and Gynecology

## 2021-11-27 ENCOUNTER — Other Ambulatory Visit: Payer: Self-pay

## 2021-11-27 ENCOUNTER — Ambulatory Visit: Payer: Self-pay | Admitting: Advanced Practice Midwife

## 2021-11-27 ENCOUNTER — Encounter: Payer: Self-pay | Admitting: Advanced Practice Midwife

## 2021-11-27 DIAGNOSIS — E663 Overweight: Secondary | ICD-10-CM | POA: Insufficient documentation

## 2021-11-27 DIAGNOSIS — Z3009 Encounter for other general counseling and advice on contraception: Secondary | ICD-10-CM

## 2021-11-27 DIAGNOSIS — Z30013 Encounter for initial prescription of injectable contraceptive: Secondary | ICD-10-CM

## 2021-11-27 LAB — WET PREP FOR TRICH, YEAST, CLUE
Trichomonas Exam: NEGATIVE
Yeast Exam: NEGATIVE

## 2021-11-27 LAB — HEMOGLOBIN, FINGERSTICK: Hemoglobin: 13.6 g/dL (ref 11.1–15.9)

## 2021-11-27 LAB — PREGNANCY, URINE: Preg Test, Ur: NEGATIVE

## 2021-11-27 MED ORDER — MEDROXYPROGESTERONE ACETATE 150 MG/ML IM SUSP
150.0000 mg | INTRAMUSCULAR | Status: AC
  Administered 2021-11-27 – 2022-07-27 (×4): 150 mg via INTRAMUSCULAR

## 2021-11-27 NOTE — Progress Notes (Signed)
PT and wet mount negative. Provider aware. Hgb =13.6 today. Depo given today, per provider orders, tolerated well, next Depo card given. Dental list given, and patient counseled to call with questions and needs PE in 1 year. Patient counseled to do home PT on 12/10/21 and call us if it's positive.Marland KitchenMarland KitchenBurt Knack, RN

## 2021-11-27 NOTE — Progress Notes (Signed)
[facility] - [place] Department  Post Partum Exam  Hannah Savage Burnetta Sabin is a 25 y.o. MHF nonsmoker G39P1001 female who presents for a postpartum visit. She is 7 weeks postpartum following a low cervical transverse Cesarean section on 10/09/21. IOL with pit and cytotec for oligo with chorio, preeclampsia, failed vacuum and fetal distress so c/s done with bilateral uterine extensions for M 6#9.  PP hemorrhage with 2UPRBC's pp.  I have fully reviewed the prenatal and intrapartum course. The delivery was at 38 4/7 gestational weeks.  Anesthesia: epidural. Postpartum course has been wnl. Baby's course has been wnl. Baby is feeding by  breast and bottle.   Her aunt helps her with the baby. Baby is nursing at night and formula during the day q 2 hours (3 oz). Baby weighed 8 lbs on 11/09/21. No pp menses. No more lochia. Pp coitus x 1 yesterday without condom; with current partner x 5 years; 1 sex partner in last 3 mo.  Living with FOB and baby. Denies cigs, vaping, cigars, MJ, or ETOH ever. Last pap 04/06/21 neg. Never been to dentist in her life. Bleeding no bleeding. Bowel function is normal. Bladder function is normal. Patient is sexually active. Desired contraception method is No Method - Other Reason   Postpartum depression screening:  Edinburgh Postnatal Depression Scale - 11/27/21 1530       Edinburgh Postnatal Depression Scale:  In the Past 7 Days   I have been able to laugh and see the funny side of things. 0    I have looked forward with enjoyment to things. 0    I have blamed myself unnecessarily when things went wrong. 0    I have been anxious or worried for no good reason. 0    I have felt scared or panicky for no good reason. 0    Things have been getting on top of me. 0    I have been so unhappy that I have had difficulty sleeping. 0    I have felt sad or miserable. 0    I have been so unhappy that I have been crying. 0    The thought of harming myself has occurred to me. 0     Edinburgh Postnatal Depression Scale Total 0              The following portions of the patient's history were reviewed and updated as appropriate: allergies, current medications, past family history, past medical history, past social history, past surgical history, and problem list. Last pap smear done 04/06/21 and was Normal  Review of Systems Pertinent items are noted in HPI.    Objective:  BP 129/78   Wt 170 lb (77.1 kg)   LMP  (LMP Unknown) Comment: no period since birth of baby  Breastfeeding Yes   BMI 27.44 kg/m   Gen: well appearing, NAD HEENT: no scleral icterus CV: RR Lung: Normal WOB Breast:performed-yes lactating Ext: warm well perfused  GU: abdomen soft, poor tone, without masses or tenderness, incision healing well Vagina erythematous with large amt thin green leukorrhea, ph>4.5, denies sxs Uterus--NSSC Rectal: performed -  not indicated       Assessment:    7 wks postpartum exam. Pap smear not done at today's visit.   Plan:   Essential components of care per ACOG recommendations for Comprehensive Postpartum exam:  1.  Mood and well being: Patient with negative depression screening today. Reviewed local resources for support. EPDS is low risk. Reviewed resources and that mood  sx in first year after pregnancy are considered related to pregnancy and to reach out for help at [facility] if needed. Discussed [facility] as link to care and availability of LCSW for counseling  - Patient does not use tobacco.  - hx of drug use? No   2. Infant care and feeding:  -Patient currently breastmilk feeding? Yes If breastmilk feeding discussed return to work and pumping. If needed, patient was provided letter for work to allow for every 2-3 hr pumping breaks, and to be granted a private location to express breastmilk and refrigerated area to store breastmilk. Reviewed importance of draining breast regularly to support lactation. I  -Recommended patient engage with WIC/BFpeer  counselors  -Counseled to sign new child up for [facility] services -Social determinants of health (SDOH) reviewed in EPIC. No concerns  3. Sexuality, contraception and birth spacing  Contraception: Contraception counseling: Reviewed all forms of birth control options in the tiered based approach. available including abstinence; over the counter/barrier methods; hormonal contraceptive medication including pill, patch, ring, injection,contraceptive implant; hormonal and nonhormonal IUDs; permanent sterilization options including vasectomy and the various tubal sterilization modalities. Risks, benefits, and typical effectiveness rates were reviewed.  Questions were answered.  Written information was also given to the patient to review.  Patient desires DMPA, this was prescribed for patient. She will follow up in  11-13 wks for surveillance.  She was told to call with any further questions, or with any concerns about this method of contraception.  Emphasized use of condoms 100% of the time for STI prevention.  Patient was offered ECP. ECP was not accepted by the patient. ECP counseling was not given - see RN documentation  - Patient does not want a pregnancy in the next year.  Desired family size is 1 children.  - Reviewed forms of contraception in tiered fashion. Patient desired Depo-Provera today.   - Discussed birth spacing of 18 months  4. Sleep and fatigue -Encouraged family/partner/community support of 4 hrs of uninterrupted sleep to help with mood and fatigue  5. Physical Recovery  - Discussed patients delivery and complications - Patient had a 0 degree laceration, perineal healing reviewed. Patient expressed understanding - Patient has urinary incontinence? No - Patient is safe to resume physical and sexual activity  6.  Health Maintenance/Chronic Disease Health Maintenance Due  Topic Date Due   COVID-19 Vaccine (1) Never done   HPV VACCINES (1 - 2-dose series) Never done    - Last pap  smear performed 04/06/21 and was normal   1. Postpartum exam If PT neg today may have DMPA 150 mg IM q 11-13 wks x 1 year Pt refuses ECP today Please counsel abstinance next 7 days Please counsel needs to do home PT 12/10/21 and call us if + Treat wet mount per standing orders Immunization nurse consult Needs dental list today  - Hemoglobin, fingerstick - WET PREP FOR TRICH, YEAST, CLUE - Pregnancy, urine - Chlamydia/Gonorrhea [place] [facility] - Syphilis Serology, [place] [facility] - HIV [place] [facility] - medroxyPROGESTERone (DEPO-PROVERA) injection 150 mg  2. Overweight BMI=27.4    Patient given handout about PCP care in the community Given MVI per family planning program guidelines and availability  Follow up in:  11-13  weeks or as needed.

## 2021-11-27 NOTE — Progress Notes (Signed)
Patient here for PP appointments. Had C-section 10/10/2021 at 38 4/7. States she had a blood transfusion at the hospital. Had a 2 week incision check at [facility]. Desires Depo for BCM. Needs Hgb check today.Burt Knack, RN

## 2022-02-15 ENCOUNTER — Ambulatory Visit (LOCAL_COMMUNITY_HEALTH_CENTER): Payer: Self-pay

## 2022-02-15 ENCOUNTER — Other Ambulatory Visit: Payer: Self-pay

## 2022-02-15 VITALS — BP 118/65 | HR 85 | Ht 66.0 in

## 2022-02-15 DIAGNOSIS — Z30013 Encounter for initial prescription of injectable contraceptive: Secondary | ICD-10-CM

## 2022-02-15 DIAGNOSIS — Z3009 Encounter for other general counseling and advice on contraception: Secondary | ICD-10-CM

## 2022-02-15 MED ORDER — PRENATAL VITAMIN 27-0.8 MG PO TABS: 1.0000 | ORAL_TABLET | Freq: Every day | ORAL | 0 refills | Status: AC

## 2022-02-15 NOTE — Progress Notes (Signed)
Depo administered per Hazle Coca CNM written order on 11/13/2021 and client tolerated without complaint. As client breastfeeding, one bottle of PNV dispensed per standing order by Dr. Alvester Morin. Jossie Ng, RN

## 2022-05-11 ENCOUNTER — Ambulatory Visit (LOCAL_COMMUNITY_HEALTH_CENTER): Payer: Self-pay

## 2022-05-11 VITALS — BP 123/84 | Ht 66.0 in | Wt 165.5 lb

## 2022-05-11 DIAGNOSIS — Z3009 Encounter for other general counseling and advice on contraception: Secondary | ICD-10-CM

## 2022-05-11 DIAGNOSIS — Z3042 Encounter for surveillance of injectable contraceptive: Secondary | ICD-10-CM

## 2022-05-11 NOTE — Progress Notes (Signed)
Interpretation provide by Salli Real  Patient 12 week 1 day post dept.  States no concerns.  Depo given today per order by Hazle Coca, CNM on 11/27/2021.  Tolerated well in left deltoid.  Next dep due 07/27/2022.  Demaryius Imran Sherrilyn Rist, RN

## 2022-07-27 ENCOUNTER — Ambulatory Visit (LOCAL_COMMUNITY_HEALTH_CENTER): Payer: Self-pay

## 2022-07-27 VITALS — BP 115/63 | Ht 66.0 in | Wt 166.0 lb

## 2022-07-27 DIAGNOSIS — Z3042 Encounter for surveillance of injectable contraceptive: Secondary | ICD-10-CM

## 2022-07-27 DIAGNOSIS — Z3009 Encounter for other general counseling and advice on contraception: Secondary | ICD-10-CM

## 2022-07-27 NOTE — Progress Notes (Signed)
11 weeks post depo.  Desires to continue with method.  Language line interpreter Aram Beecham id# [phone].  Asked RN to look at right axillary area stating area has bulged since about 2 months before she delivered in October 2022.  Currently breast feeding.  Denies pain at site unless she squeezes it or presses it hard she says.  RN palpated area and soft.    Also c/o bruising on legs and arms "all of a sudden" and denies any injury to area.  No bruising seen on arms during visit.  Wearing long pants and did not observe bruising on legs.    Consulted Glenna Fellows FNP and recommended pt could be seen in [facility] for problem visit.  Instructed pt.  States no PCP and gave resource list if needed.    Depo given IM left deltoid per order by Hazle Coca CNM dated 11-27-21; tolerated well.  Next depo due 10/12/22; appt reminder given.   Cherlynn Polo, RN

## 2022-07-31 NOTE — Progress Notes (Signed)
Consulted by RN re: patient situation.  Reviewed RN note and agree that it reflects our discussion and my recommendations. Sheena Donegan, FNP  

## 2022-10-23 ENCOUNTER — Ambulatory Visit (LOCAL_COMMUNITY_HEALTH_CENTER): Payer: Self-pay

## 2022-10-23 VITALS — BP 115/78 | Ht 66.0 in | Wt 172.5 lb

## 2022-10-23 DIAGNOSIS — Z3009 Encounter for other general counseling and advice on contraception: Secondary | ICD-10-CM

## 2022-10-23 DIAGNOSIS — Z3042 Encounter for surveillance of injectable contraceptive: Secondary | ICD-10-CM

## 2022-10-23 MED ORDER — MEDROXYPROGESTERONE ACETATE 150 MG/ML IM SUSP
150.0000 mg | Freq: Once | INTRAMUSCULAR | Status: AC
  Administered 2022-10-23: 150 mg via INTRAMUSCULAR

## 2022-10-23 NOTE — Progress Notes (Addendum)
12 weeks 4 days post depo.  Denies problems and desires to continue.  At last visit c/o bruising on arms and legs but states no problem now.  Still has bulging area in right axillary area but says it does not bother her.  RN observed and no change from last visit.  Pt is due pe next visit and will tell provider to look at area.  Depo given IM right deltoid per order by E. Sciora CNM dated 11/27/21; tolerated well.  Next depo due 01/08/23; also annual exam due.  Has appt reminder and stressed schedule appt as close to 11 weeks from today as possible.    Tonny Branch, RN

## 2023-01-09 ENCOUNTER — Ambulatory Visit (LOCAL_COMMUNITY_HEALTH_CENTER): Payer: Self-pay | Admitting: Nurse Practitioner

## 2023-01-09 VITALS — BP 112/69 | HR 73 | Ht 66.0 in | Wt 175.2 lb

## 2023-01-09 DIAGNOSIS — Z01419 Encounter for gynecological examination (general) (routine) without abnormal findings: Secondary | ICD-10-CM

## 2023-01-09 DIAGNOSIS — Z3042 Encounter for surveillance of injectable contraceptive: Secondary | ICD-10-CM

## 2023-01-09 DIAGNOSIS — Z113 Encounter for screening for infections with a predominantly sexual mode of transmission: Secondary | ICD-10-CM

## 2023-01-09 DIAGNOSIS — Z3009 Encounter for other general counseling and advice on contraception: Secondary | ICD-10-CM

## 2023-01-09 LAB — WET PREP FOR TRICH, YEAST, CLUE
Trichomonas Exam: NEGATIVE
Yeast Exam: NEGATIVE

## 2023-01-09 LAB — HM HIV SCREENING LAB: HM HIV Screening: NEGATIVE

## 2023-01-09 MED ORDER — MEDROXYPROGESTERONE ACETATE 150 MG/ML IM SUSP
150.0000 mg | INTRAMUSCULAR | Status: AC
  Administered 2023-01-09 – 2023-11-25 (×5): 150 mg via INTRAMUSCULAR

## 2023-01-09 NOTE — Progress Notes (Signed)
Pt. seen for annual physical, BC management, and routine STI screening. Seen by FNP White. Wet mount results reviewed with pt.  Depo shot given in left deltoid, education given and questions answered. Condoms given. Family planning packet given and contents reviewed.

## 2023-01-09 NOTE — Progress Notes (Signed)
[facility] Main Number: [phone]    Family Planning Visit- Initial Visit  Subjective:  Hannah Savage is a 27 y.o.  G1P1001   being seen today for an initial annual visit and to discuss reproductive life planning.  The patient is currently using Hormonal Injection for pregnancy prevention. Patient reports she/her/hers  does not want a pregnancy in the next year.    she/her/hers report they are looking for a method that provides High efficacy at preventing pregnancy  Patient has the following medical conditions has Supervision of high-risk pregnancy, unspecified trimester; Obesity affecting pregnancy, antepartum; Positive depression screening; Rubella non-immune status, antepartum; Oligohydramnios on 09/29/21 u/s @36  wks; Fetal intolerance to labor, delivered, current hospitalization; Mild pre-eclampsia in third trimester; and Overweight BMI=27.4 on their problem list.  Chief Complaint  Patient presents with   Annual Exam    PE and depo    Patient reports to clinic today for a physical and Depo   Body mass index is 28.28 kg/m. - Patient is eligible for diabetes screening based on BMI> 25 and age >74?  not applicable BU3A ordered? not applicable  Patient reports 1  partner/s in last year. Desires STI screening?  Yes  Has patient been screened once for HCV in the past?  Yes    Does the patient have current drug use (including MJ), have a partner with drug use, and/or has been incarcerated since last result? No  If yes-- Screen for HCV through [facility]   Does the patient meet criteria for HBV testing? No  Criteria:  -Household, sexual or needle sharing contact with HBV -History of drug use -HIV positive -Those with known Hep C   Health Maintenance Due  Topic Date Due   COVID-19 Vaccine (1) Never done   HPV VACCINES (1 - 2-dose series) Never done   INFLUENZA VACCINE   07/24/2022    Review of Systems  Constitutional:  Negative for chills, fever, malaise/fatigue and weight loss.  HENT:  Negative for congestion, hearing loss and sore throat.   Eyes:  Negative for blurred vision, double vision and photophobia.  Respiratory:  Negative for shortness of breath.   Cardiovascular:  Negative for chest pain.  Gastrointestinal:  Negative for abdominal pain, blood in stool, constipation, diarrhea, heartburn, nausea and vomiting.  Genitourinary:  Negative for dysuria and frequency.  Musculoskeletal:  Negative for back pain, joint pain and neck pain.  Skin:  Negative for itching and rash.  Neurological:  Negative for dizziness, weakness and headaches.  Endo/Heme/Allergies:  Does not bruise/bleed easily.  Psychiatric/Behavioral:  Negative for depression, substance abuse and suicidal ideas.     The following portions of the patient's history were reviewed and updated as appropriate: allergies, current medications, past family history, past medical history, past social history, past surgical history and problem list. Problem list updated.   See flowsheet for other program required questions.  Objective:   Vitals:   01/09/23 1332  BP: 112/69  Pulse: 73  Weight: 175 lb 3.2 oz (79.5 kg)  Height: 5\' 6"  (1.676 m)    Physical Exam Constitutional:      Appearance: Normal appearance.  HENT:     Head: Normocephalic. No abrasion, masses or laceration. Hair is normal.     Jaw: No tenderness or swelling.     Right Ear: External ear normal.     Left Ear: External ear normal.     Nose: Nose normal.  Mouth/Throat:     Lips: Pink. No lesions.     Mouth: Mucous membranes are moist. No lacerations or oral lesions.     Dentition: No dental caries.     Tongue: No lesions.     Palate: No mass and lesions.     Pharynx: No pharyngeal swelling, oropharyngeal exudate, posterior oropharyngeal erythema or uvula swelling.     Tonsils: No tonsillar exudate or tonsillar  abscesses.  Neck:     Thyroid: No thyroid mass, thyromegaly or thyroid tenderness.  Cardiovascular:     Rate and Rhythm: Normal rate and regular rhythm.  Pulmonary:     Effort: Pulmonary effort is normal.     Breath sounds: Normal breath sounds.  Chest:  Breasts:    Right: Normal. No swelling, mass, nipple discharge, skin change or tenderness.     Left: Normal. No swelling, mass, nipple discharge, skin change or tenderness.  Abdominal:     General: Abdomen is flat. Bowel sounds are normal.     Palpations: Abdomen is soft.     Tenderness: There is no abdominal tenderness. There is no rebound.  Genitourinary:    Pubic Area: No rash or pubic lice.      Labia:        Right: No rash, tenderness or lesion.        Left: No rash, tenderness or lesion.      Vagina: Normal. No vaginal discharge, erythema, tenderness or lesions.     Cervix: No cervical motion tenderness, discharge, lesion or erythema.     Uterus: Normal.      Adnexa:        Right: No tenderness.         Left: No tenderness.       Rectum: Normal.     Comments: Amount Discharge: small  Odor: No pH: less than 4.5 Adheres to vaginal wall: No Color: Hannah Savage Musculoskeletal:     Cervical back: Full passive range of motion without pain and normal range of motion.  Lymphadenopathy:     Cervical: No cervical adenopathy.     Right cervical: No superficial, deep or posterior cervical adenopathy.    Left cervical: No superficial, deep or posterior cervical adenopathy.     Upper Body:     Right upper body: No supraclavicular, axillary or epitrochlear adenopathy.     Left upper body: No supraclavicular, axillary or epitrochlear adenopathy.     Lower Body: No right inguinal adenopathy. No left inguinal adenopathy.  Skin:    General: Skin is warm and dry.     Findings: No erythema, laceration, lesion or rash.  Neurological:     Mental Status: She is alert and oriented to person, place, and time.  Psychiatric:        Attention and  Perception: Attention normal.        Mood and Affect: Mood normal.        Speech: Speech normal.        Behavior: Behavior normal. Behavior is cooperative.       Assessment and Plan:  Hannah Savage is a 27 y.o. female presenting to the [facility] Department for an initial annual wellness/contraceptive visit  Contraception counseling: Reviewed options based on patient desire and reproductive life plan. Patient is interested in Hormonal Injection. This was provided to the patient today.   Risks, benefits, and typical effectiveness rates were reviewed.  Questions were answered.  Written information was also given to the patient to review.  The patient will follow up in  11 weeks for surveillance and Depo.  The patient was told to call with any further questions, or with any concerns about this method of contraception.  Emphasized use of condoms 100% of the time for STI prevention.  Need for ECP was assessed.   ECP not offered due to continuous use of birth control.   1. Family planning counseling -27 year old female in clinic today for a physical and Depo. -ROS reviewed, no complaints noted. -Patient agrees to STD screening today. -Patient desires to continue with Depo as a birth control method.   2. Well woman exam with routine gynecological exam -Normal well woman exam. -CBE today, next due 12/2025 -PAP due 03/2024  3. Screening examination for venereal disease -STD screening today. -Patient accepted all screenings including vaginal CT/GC and bloodwork for HIV/RPR.  Patient meets criteria for HepB screening? No. Ordered? No - low risk  Patient meets criteria for HepC screening? No. Ordered? No - low  risk   Treat wet prep per standing order Discussed time line for [facility] results and that patient will be called with positive results and encouraged patient to call if she had not heard in 2 weeks.  Counseled to return or seek care for continued or  worsening symptoms Recommended condom use with all sex  Patient is currently using Hormonal Contraception: Injection, Rings and Patches to prevent pregnancy.    - HIV [place] [facility] - Syphilis Serology, [place] [facility] - Chlamydia/Gonorrhea [place] [facility] - WET PREP FOR TRICH, YEAST, CLUE  4. Surveillance for Depo-Provera contraception -May have Depo 150 MG IM q 11-13 weeks x q 1 year.   - medroxyPROGESTERone (DEPO-PROVERA) injection 150 mg  Total time spent: 30 minutes    Return in about 11 weeks (around 03/27/2023) for Routine DMPA injection.    Glenna Fellows, FNP

## 2023-01-09 NOTE — Progress Notes (Signed)
Pt. seen for annual physical, BC management, and routine STI screening. Seen by FNP White. Wet mount results reviewed with pt.  Depo shot given in left deltoid, education given and questions answered. Condoms given. Family planning packet given and contents reviewed. 

## 2023-04-01 ENCOUNTER — Ambulatory Visit (LOCAL_COMMUNITY_HEALTH_CENTER): Payer: Self-pay

## 2023-04-01 ENCOUNTER — Ambulatory Visit: Payer: Self-pay

## 2023-04-01 VITALS — BP 115/73 | Ht 66.0 in | Wt 175.5 lb

## 2023-04-01 DIAGNOSIS — Z3009 Encounter for other general counseling and advice on contraception: Secondary | ICD-10-CM

## 2023-04-01 DIAGNOSIS — Z3042 Encounter for surveillance of injectable contraceptive: Secondary | ICD-10-CM

## 2023-04-01 NOTE — Progress Notes (Signed)
11 weeks and 5 days post Depo. Reported no concerns or complaints.  Spotting only when next next hormonal injection is due. Medroxyprogesterone Acetate 150 mg given per order by Glenna Fellows, FNP dated 01/09/2023.  Given IM right deltoid. Tolerated well. Reminder card to call for next appointment due 06/17/2023.  [place] language line used Okey Regal # [phone] interpreter.

## 2023-06-17 ENCOUNTER — Ambulatory Visit (LOCAL_COMMUNITY_HEALTH_CENTER): Payer: Self-pay

## 2023-06-17 ENCOUNTER — Ambulatory Visit: Payer: Self-pay

## 2023-06-17 VITALS — BP 109/70 | Ht 66.0 in | Wt 179.5 lb

## 2023-06-17 DIAGNOSIS — Z3009 Encounter for other general counseling and advice on contraception: Secondary | ICD-10-CM

## 2023-06-17 DIAGNOSIS — Z3042 Encounter for surveillance of injectable contraceptive: Secondary | ICD-10-CM

## 2023-06-17 NOTE — Progress Notes (Signed)
11 weeks 0 days post depo. Voices no concerns. Depo given per order A. White, FNP dated 01/09/23. Tolerated well to L. Delt. Next depo due  09/02/23.

## 2023-09-04 ENCOUNTER — Ambulatory Visit (LOCAL_COMMUNITY_HEALTH_CENTER): Payer: Self-pay

## 2023-09-04 VITALS — BP 119/73 | Ht 66.0 in | Wt 181.5 lb

## 2023-09-04 DIAGNOSIS — Z3042 Encounter for surveillance of injectable contraceptive: Secondary | ICD-10-CM

## 2023-09-04 DIAGNOSIS — Z3009 Encounter for other general counseling and advice on contraception: Secondary | ICD-10-CM

## 2023-09-04 NOTE — Progress Notes (Signed)
11w 2d post depo. Patient reports having some nausea and asks if it is possible she could be pregnant. RN educated the patient that it is less likely that she is pregnant, as she has been using the injection correctly and getting the injection at the right time interval. RN advises patient to follow up with PCP if nausea persists. Patient verbalizes understanding. Depo given today per order by A. White, FNP dated 01/09/23. Tolerated well in R deltoid. Next depo due 11/20/23; patient aware.   Interpreter, Marlene Yemen, present during this visit.  [name], RN

## 2023-11-25 ENCOUNTER — Ambulatory Visit: Payer: Self-pay

## 2023-11-25 VITALS — BP 122/67 | Ht 66.0 in | Wt 180.0 lb

## 2023-11-25 DIAGNOSIS — Z3042 Encounter for surveillance of injectable contraceptive: Secondary | ICD-10-CM

## 2023-11-25 DIAGNOSIS — Z3009 Encounter for other general counseling and advice on contraception: Secondary | ICD-10-CM

## 2023-11-25 NOTE — Progress Notes (Signed)
11 Weeks   5 Days since last Depo Interpreter: [name]   Voices no concerns today.  Counseled to adhere to 11 to 13 week intervals between depo injections for optimal benefit.  Depo given today per order by Onalee Hua, FNP  dated 01/09/2023.  Tolerated well L delt.  Next depo and annual PE due 02/10/2024 has reminder card.  Hannah Shepherd, RN

## 2023-12-04 ENCOUNTER — Ambulatory Visit: Payer: Self-pay

## 2024-02-11 ENCOUNTER — Ambulatory Visit: Payer: Self-pay

## 2024-02-19 ENCOUNTER — Other Ambulatory Visit: Payer: Self-pay

## 2024-02-19 ENCOUNTER — Ambulatory Visit (LOCAL_COMMUNITY_HEALTH_CENTER): Payer: Self-pay | Admitting: Nurse Practitioner

## 2024-02-19 VITALS — BP 129/75 | HR 103 | Ht 66.0 in | Wt 176.2 lb

## 2024-02-19 DIAGNOSIS — Z3009 Encounter for other general counseling and advice on contraception: Secondary | ICD-10-CM

## 2024-02-19 DIAGNOSIS — Z30013 Encounter for initial prescription of injectable contraceptive: Secondary | ICD-10-CM

## 2024-02-19 DIAGNOSIS — Z01419 Encounter for gynecological examination (general) (routine) without abnormal findings: Secondary | ICD-10-CM

## 2024-02-19 DIAGNOSIS — Z113 Encounter for screening for infections with a predominantly sexual mode of transmission: Secondary | ICD-10-CM

## 2024-02-19 DIAGNOSIS — N6323 Unspecified lump in the left breast, lower outer quadrant: Secondary | ICD-10-CM

## 2024-02-19 LAB — WET PREP FOR TRICH, YEAST, CLUE: Trichomonas Exam: NEGATIVE

## 2024-02-19 LAB — HM HIV SCREENING LAB: HM HIV Screening: NEGATIVE

## 2024-02-19 MED ORDER — MEDROXYPROGESTERONE ACETATE 150 MG/ML IM SUSP
150.0000 mg | INTRAMUSCULAR | Status: AC
  Administered 2024-02-19 – 2024-07-24 (×3): 150 mg via INTRAMUSCULAR

## 2024-02-19 NOTE — Progress Notes (Signed)
 [employer] [facility] DEPART[facility] [address] [place] [address] Main phone: [phone]  Family Planning Visit - Repeat Yearly Visit  Subjective:  Hannah Savage is a 28 y.o. G1P1001  being seen today for an annual wellness visit and to discuss contraception options. The patient is currently using hormonal injection for pregnancy prevention. Patient does not want a pregnancy in the next year.   Patient reports they are looking for a method with the following characteristics:  High efficacy at preventing pregnancy  Patient has the following medical problems:  There are no active problems to display for this patient.   Chief Complaint  Patient presents with   Annual Exam    PE/Depo    HPI Patient is a pleasant 28 y.o. female who presents to the clinic today for annual well woman exam including PAP, STI testing, and depo injection. Patient has no concerns today. Last PAP=NILM, no HPV testing done 03/2021. Patient reports not having periods monthly while on Depo but does report spotting at times.  Patient indicates 1 female partner in the last 12 months. She practices vaginal sex without condom use. She uses hormonal injection for contraception. She has no history of STI.   Review of Systems  Constitutional:  Negative for weight loss.  HENT:  Negative for sore throat.   Eyes:  Negative for blurred vision.  Respiratory:  Negative for cough, shortness of breath and wheezing.   Cardiovascular:  Negative for chest pain and claudication.  Gastrointestinal:  Negative for nausea and vomiting.  Genitourinary:  Negative for dysuria and frequency.  Skin:  Negative for rash.  Neurological:  Negative for dizziness, seizures and headaches.  Endo/Heme/Allergies:  Does not bruise/bleed easily.  Psychiatric/Behavioral:  Negative for depression.     See flowsheet for other program required questions.   Diabetes  screening This patient is 28 y.o. with a BMI of Body mass index is 28.44 kg/m.Marland Kitchen  Is patient eligible for diabetes screening (age >35 and BMI >25)?  no  Was Hgb A1c ordered? not applicable  STI screening Patient reports 1 of partners in last year.  Does this patient desire STI screening?  Yes  Hepatitis C screening Has patient been screened once for HCV in the past?  No  No results found for: "HCVAB"  Does the patient meet criteria for HCV testing? No  (If yes-- Screen for HCV through [facility]) Criteria:  Since the last HCV result, does the patient have any of the following? - Current drug use - Have a partner with drug use - Has been incarcerated  Hepatitis B screening Does the patient meet criteria for HBV testing? No Criteria:  -Household, sexual or needle sharing contact with HBV -History of drug use -HIV positive -Those with known Hep C  Cervical Cancer Screening  Result Date Procedure Results Follow-ups  04/06/2021 Pap IG (Image Guided) DIAGNOSIS:: Comment Specimen adequacy:: Comment Clinician Provided ICD10: Comment Performed by:: Comment PAP Smear Comment: . Note:: Comment Test Methodology: Comment   11/06/2017 HM PAP SMEAR HM Pap smear: Negative     Health Maintenance Due  Topic Date Due   INFLUENZA VACCINE  07/25/2023   COVID-19 Vaccine (1 - 2024-25 season) Never done   Cervical Cancer Screening (Pap smear)  04/06/2024    The following portions of the patient's history were reviewed and updated as appropriate: allergies, current medications, past family history, past medical history, past social history, past surgical history and problem list.  Problem list updated.  Objective:   Vitals:   02/19/24 1104  BP: 129/75  Pulse: (!) 103  Weight: 176 lb 3.2 oz (79.9 kg)  Height: 5\' 6"  (1.676 m)    Physical Exam Vitals and nursing note reviewed. Exam conducted with a chaperone present Hannah Savage, [place] interpreter, present as chaperone).   Constitutional:      Appearance: Normal appearance.  HENT:     Head: Normocephalic.     Salivary Glands: Right salivary gland is not diffusely enlarged or tender. Left salivary gland is not diffusely enlarged or tender.     Mouth/Throat:     Lips: Pink. No lesions.     Mouth: Mucous membranes are moist.     Dentition: Abnormal dentition.     Tongue: No lesions. Tongue does not deviate from midline.     Pharynx: Oropharynx is clear. Uvula midline. No oropharyngeal exudate or posterior oropharyngeal erythema.     Tonsils: No tonsillar exudate.      Comments: Missing lower right tooth. Eyes:     General:        Right eye: No discharge.        Left eye: No discharge.  Neck:     Thyroid: No thyroid mass or thyroid tenderness.     Trachea: Trachea and phonation normal. No tracheal tenderness or tracheal deviation.  Cardiovascular:     Rate and Rhythm: Regular rhythm. Tachycardia present.     Heart sounds: Normal heart sounds, S1 normal and S2 normal.     Comments: Sinus tachy to 103bpm during exam.  Pulmonary:     Effort: Pulmonary effort is normal.     Breath sounds: Normal breath sounds and air entry.  Chest:  Breasts:    Tanner Score is 5.     Breasts are symmetrical.     Right: Normal.     Left: Mass present.       Comments: Area indicated in diagram is consistent with .5cm round in diameter with regular borders, mobile, hard mass. Patient reports it is painful. Abdominal:     General: Abdomen is flat. Bowel sounds are normal.     Palpations: Abdomen is soft.  Genitourinary:    General: Normal vulva.     Exam position: Lithotomy position.     Pubic Area: No rash or pubic lice.      Tanner stage (genital): 5.     Labia:        Right: No rash, tenderness, lesion or injury.        Left: No rash, tenderness, lesion or injury.      Vagina: No signs of injury and foreign body. Vaginal discharge and bleeding present. No erythema, tenderness or lesions.     Cervix: Normal. No  cervical motion tenderness, discharge, friability, lesion, erythema, cervical bleeding or eversion.     Uterus: Normal.      Adnexa: Right adnexa normal and left adnexa normal.     Comments: Bleeding consistent with menstruation spotting. Thick consistency. Slightly malodorous.  Lymphadenopathy:     Head:     Right side of head: No submental, submandibular, tonsillar, preauricular or posterior auricular adenopathy.     Left side of head: No submental, submandibular, tonsillar, preauricular or posterior auricular adenopathy.     Cervical: No cervical adenopathy.     Right cervical: No superficial or posterior cervical adenopathy.    Left cervical: No superficial or posterior cervical adenopathy.     Upper Body:  Right upper body: No supraclavicular or axillary adenopathy.     Left upper body: No supraclavicular or axillary adenopathy.     Lower Body: No right inguinal adenopathy. No left inguinal adenopathy.  Skin:    General: Skin is warm and dry.  Neurological:     Mental Status: She is alert and oriented to person, place, and time.  Psychiatric:        Attention and Perception: Attention and perception normal.        Mood and Affect: Mood and affect normal.        Speech: Speech normal.        Behavior: Behavior normal. Behavior is cooperative.        Thought Content: Thought content normal.     Assessment and Plan:  Rylyn Guadalupe Menjivar Burnetta Savage is a 28 y.o. female G1P1001 presenting to the [facility] Department for an yearly wellness and contraception visit  Contraception counseling: Reviewed options based on patient desire and reproductive life plan. Patient is interested in Hormonal Injection. This was provided to the patient today.   Risks, benefits, and typical effectiveness rates were reviewed.  Questions were answered.  Written information was also given to the patient to review.    The patient will follow up in  3 months for surveillance.  The patient  was told to call with any further questions, or with any concerns about this method of contraception.  Emphasized use of condoms 100% of the time for STI prevention.  Educated on ECP and assessed need for ECP. Patient does not qualify for ECP based on no unprotected sex.    1. Family planning (Primary) Contraception counseling: Reviewed options based on patient desire and reproductive life plan. Patient is interested in Hormonal Injection. This was provided to the patient today.   Risks, benefits, and typical effectiveness rates were reviewed.  Questions were answered.  Written information was also given to the patient to review.    The patient will follow up in  3 months for surveillance.  The patient was told to call with any further questions, or with any concerns about this method of contraception.  Emphasized use of condoms 100% of the time for STI prevention.  Educated on ECP and assessed need for ECP. Patient does not qualify for ECP based on no unprotected sex.   - medroxyPROGESTERone (DEPO-PROVERA) injection 150 mg  2. Well woman exam CBE performed today per ACOG guidelines. Mass/lump noted in lower outer quadrant of left breast that patient reports is tender upon palpation. Patient was not aware of this area being present. She indicates no self-breast exam at home. (See PE). Will refer patient to [facility] for imaging.  PAP performed today.  PCP list not provided as patient is not new to this clinic and has a PCP established.   - IGP, rfx Aptima HPV ASCU  3. Screening for venereal disease  - Chlamydia/Gonorrhea [facility] - HIV [place] [facility] - Syphilis Serology, [facility] - WET PREP FOR TRICH, YEAST, CLUE  Due to language barrier, a Spanish interpreter Rachel Bo T.) was present in person during the history-taking, subsequent discussion, and physical exam with this patient.   Return in about 3 months (around 05/18/2024) for depo injection.  No future appointments.  Edmonia James, NP

## 2024-02-19 NOTE — Progress Notes (Signed)
 Hannah Savage

## 2024-02-19 NOTE — Progress Notes (Signed)
 Patient is here for PE and Depo.Wet prep results reviewed with pt, no treatment required per standing order. Depo given at R. Deltoid and pt tolerated well to injection. Condoms declined and reminder card given. Sonda Primes, RN.

## 2024-02-24 LAB — IGP, RFX APTIMA HPV ASCU: PAP Smear Comment: 0

## 2024-02-27 NOTE — Progress Notes (Signed)
 NILM, HPV criteria not met. Repeat pap test in 3 years per ASCCP guidelines. Please send letter to patient. Thank you, Marylu Lund L. Mairen Wallenstein, FNP-C

## 2024-03-02 ENCOUNTER — Ambulatory Visit: Payer: Self-pay | Attending: Hematology and Oncology | Admitting: *Deleted

## 2024-03-02 VITALS — BP 127/83 | Wt 181.0 lb

## 2024-03-02 DIAGNOSIS — N6321 Unspecified lump in the left breast, upper outer quadrant: Secondary | ICD-10-CM

## 2024-03-02 DIAGNOSIS — Z1239 Encounter for other screening for malignant neoplasm of breast: Secondary | ICD-10-CM

## 2024-03-02 NOTE — Patient Instructions (Signed)
 Explained breast self awareness with Hannah Savage. Patient did not need a Pap smear today due to last Pap smear was 02/19/2024. Let her know [facility] will cover Pap smears every 3 years unless has a history of abnormal Pap smears. Referred patient to the [facility] for a left breast ultrasound. Appointment scheduled Monday, March 09, 2024 at 1500. Patient aware of appointment and will be there. [name] verbalized understanding.  Zuriyah Shatz, Kathaleen Maser, RN 3:26 PM

## 2024-03-02 NOTE — Progress Notes (Signed)
 Hannah Savage is a 28 y.o. female who presents to [facility] clinic today with complaint of left breat lump found by provider at [facility] Department on exam 02/19/2024.    Pap Smear: Pap smear not completed today. Last Pap smear was 02/19/2024 at the [facility] Department clinic and was normal. Per patient has no history of an abnormal Pap smear. Last Pap smear result is available in Epic.   Physical exam: Breasts Breasts symmetrical. No skin abnormalities bilateral breasts. No nipple retraction bilateral breasts. No nipple discharge bilateral breasts. No lymphadenopathy. No lumps palpated right breast. Palpated a lump within the left breast at 1 o'clock 6 cm from the nipple. Complaints of tenderness when palpated left breast lump on exam.   Pelvic/Bimanual Pap is not indicated today per [facility]CP guidelines.   Smoking History: Patient has never smoked.   Patient Navigation: Patient education provided. Access to services provided for patient through [employer] program. Spanish interpreter Delos Haring from [facility] provided.   Breast and Cervical Cancer Risk Assessment: Patient does not have family history of breast cancer, known genetic mutations, or radiation treatment to the chest before age 82. Patient does not have history of cervical dysplasia, immunocompromised, or DES exposure in-utero. Breast cancer risk assessment completed. No breast cancer risk calculated due to patient is less than 28 years old.  Risk Assessment   No risk assessment data    A: B[facility]CP exam without pap smear Complaint of left breast lump.  P: Referred patient to the [facility] for a left breast ultrasound. Appointment scheduled Monday, March 09, 2024 at 1500.  Priscille Heidelberg, RN 03/02/2024 3:26 PM

## 2024-03-03 ENCOUNTER — Ambulatory Visit: Payer: Self-pay

## 2024-03-09 ENCOUNTER — Ambulatory Visit
Admission: RE | Admit: 2024-03-09 | Discharge: 2024-03-09 | Disposition: A | Discharge status: Home / Self Care | Payer: Self-pay | Source: Ambulatory Visit | Attending: Obstetrics and Gynecology | Admitting: Obstetrics and Gynecology

## 2024-03-09 DIAGNOSIS — N6323 Unspecified lump in the left breast, lower outer quadrant: Secondary | ICD-10-CM | POA: Insufficient documentation

## 2024-05-07 ENCOUNTER — Ambulatory Visit: Payer: Self-pay

## 2024-05-07 VITALS — BP 127/76 | Ht 66.0 in | Wt 181.0 lb

## 2024-05-07 DIAGNOSIS — Z3009 Encounter for other general counseling and advice on contraception: Secondary | ICD-10-CM

## 2024-05-07 DIAGNOSIS — Z3042 Encounter for surveillance of injectable contraceptive: Secondary | ICD-10-CM

## 2024-05-07 NOTE — Progress Notes (Signed)
 11w 1d post depo. Voices no concerns. Depo given today per order by Claryce Cruel, NP dated 02/19/2024. Tolerated well in L deltoid. Next depo due 07/23/2024; patient aware.   Interpreter, Kem Patten, helped during this visit.   Clare Critchley, RN

## 2024-07-24 ENCOUNTER — Ambulatory Visit (LOCAL_COMMUNITY_HEALTH_CENTER): Payer: Self-pay

## 2024-07-24 ENCOUNTER — Ambulatory Visit: Payer: Self-pay

## 2024-07-24 ENCOUNTER — Other Ambulatory Visit: Payer: Self-pay

## 2024-07-24 VITALS — BP 130/62 | Ht 66.0 in | Wt 188.0 lb

## 2024-07-24 DIAGNOSIS — Z30013 Encounter for initial prescription of injectable contraceptive: Secondary | ICD-10-CM

## 2024-07-24 DIAGNOSIS — Z3042 Encounter for surveillance of injectable contraceptive: Secondary | ICD-10-CM

## 2024-07-24 DIAGNOSIS — Z3009 Encounter for other general counseling and advice on contraception: Secondary | ICD-10-CM

## 2024-07-24 NOTE — Progress Notes (Signed)
 11weeks 1 day post depo. Voices no concerns. Depo given today per order by J.Idol,RN dated 02/19/24. Tolerated well R. Delt. Next depo due 10/09/24.
